# Patient Record
Sex: Female | Born: 1935 | Race: White | Hispanic: No | Smoking: Never smoker
Health system: Southern US, Community
[De-identification: ages and names within clinical notes are randomized; demographics above are authoritative.]

## PROBLEM LIST (undated history)

## (undated) DIAGNOSIS — E785 Hyperlipidemia, unspecified: Secondary | ICD-10-CM

## (undated) DIAGNOSIS — C50919 Malignant neoplasm of unspecified site of unspecified female breast: Secondary | ICD-10-CM

## (undated) DIAGNOSIS — M1990 Unspecified osteoarthritis, unspecified site: Secondary | ICD-10-CM

## (undated) DIAGNOSIS — Z923 Personal history of irradiation: Secondary | ICD-10-CM

## (undated) DIAGNOSIS — R42 Dizziness and giddiness: Secondary | ICD-10-CM

## (undated) DIAGNOSIS — E079 Disorder of thyroid, unspecified: Secondary | ICD-10-CM

## (undated) DIAGNOSIS — I10 Essential (primary) hypertension: Secondary | ICD-10-CM

## (undated) DIAGNOSIS — R739 Hyperglycemia, unspecified: Secondary | ICD-10-CM

## (undated) DIAGNOSIS — E039 Hypothyroidism, unspecified: Secondary | ICD-10-CM

## (undated) DIAGNOSIS — C50419 Malignant neoplasm of upper-outer quadrant of unspecified female breast: Secondary | ICD-10-CM

## (undated) DIAGNOSIS — H3530 Unspecified macular degeneration: Secondary | ICD-10-CM

## (undated) DIAGNOSIS — C4490 Unspecified malignant neoplasm of skin, unspecified: Secondary | ICD-10-CM

## (undated) HISTORY — DX: Hyperlipidemia, unspecified: E78.5

## (undated) HISTORY — PX: CATARACT EXTRACTION: SUR2

## (undated) HISTORY — DX: Hyperglycemia, unspecified: R73.9

## (undated) HISTORY — DX: Unspecified malignant neoplasm of skin, unspecified: C44.90

## (undated) HISTORY — PX: PERICARDIAL WINDOW: SHX2213

## (undated) HISTORY — DX: Unspecified macular degeneration: H35.30

## (undated) HISTORY — DX: Essential (primary) hypertension: I10

## (undated) HISTORY — DX: Disorder of thyroid, unspecified: E07.9

## (undated) HISTORY — DX: Malignant neoplasm of upper-outer quadrant of unspecified female breast: C50.419

## (undated) HISTORY — DX: Hypothyroidism, unspecified: E03.9

---

## 1966-06-12 HISTORY — PX: ABDOMINAL HYSTERECTOMY: SHX81

## 1983-06-13 DIAGNOSIS — I10 Essential (primary) hypertension: Secondary | ICD-10-CM

## 1983-06-13 HISTORY — DX: Essential (primary) hypertension: I10

## 1994-06-12 DIAGNOSIS — E079 Disorder of thyroid, unspecified: Secondary | ICD-10-CM

## 1994-06-12 HISTORY — DX: Disorder of thyroid, unspecified: E07.9

## 2004-03-15 ENCOUNTER — Ambulatory Visit: Payer: Self-pay | Admitting: Family Medicine

## 2005-04-17 ENCOUNTER — Ambulatory Visit: Payer: Self-pay | Admitting: Internal Medicine

## 2006-04-24 ENCOUNTER — Ambulatory Visit: Payer: Self-pay | Admitting: Internal Medicine

## 2006-05-04 ENCOUNTER — Ambulatory Visit: Payer: Self-pay | Admitting: Internal Medicine

## 2007-05-30 ENCOUNTER — Ambulatory Visit: Payer: Self-pay | Admitting: Internal Medicine

## 2007-06-13 HISTORY — PX: EYE SURGERY: SHX253

## 2008-06-12 HISTORY — PX: EYE SURGERY: SHX253

## 2009-04-08 ENCOUNTER — Ambulatory Visit: Payer: Self-pay | Admitting: Internal Medicine

## 2009-06-12 DIAGNOSIS — C50919 Malignant neoplasm of unspecified site of unspecified female breast: Secondary | ICD-10-CM

## 2009-06-12 HISTORY — PX: BREAST LUMPECTOMY: SHX2

## 2009-06-12 HISTORY — DX: Malignant neoplasm of unspecified site of unspecified female breast: C50.919

## 2009-06-12 HISTORY — PX: COLONOSCOPY: SHX174

## 2009-12-10 HISTORY — PX: BREAST BIOPSY: SHX20

## 2009-12-16 ENCOUNTER — Ambulatory Visit: Payer: Self-pay | Admitting: Internal Medicine

## 2009-12-22 ENCOUNTER — Ambulatory Visit: Payer: Self-pay | Admitting: Internal Medicine

## 2009-12-23 ENCOUNTER — Ambulatory Visit: Payer: Self-pay | Admitting: Internal Medicine

## 2010-01-10 ENCOUNTER — Ambulatory Visit: Payer: Self-pay | Admitting: Radiation Oncology

## 2010-01-10 ENCOUNTER — Ambulatory Visit: Payer: Self-pay | Admitting: General Surgery

## 2010-01-18 ENCOUNTER — Ambulatory Visit: Payer: Self-pay | Admitting: General Surgery

## 2010-01-18 DIAGNOSIS — C50419 Malignant neoplasm of upper-outer quadrant of unspecified female breast: Secondary | ICD-10-CM

## 2010-01-18 HISTORY — DX: Malignant neoplasm of upper-outer quadrant of unspecified female breast: C50.419

## 2010-01-24 ENCOUNTER — Ambulatory Visit: Payer: Self-pay | Admitting: Radiation Oncology

## 2010-01-25 LAB — PATHOLOGY REPORT

## 2010-02-10 ENCOUNTER — Ambulatory Visit: Payer: Self-pay | Admitting: Radiation Oncology

## 2010-03-12 ENCOUNTER — Ambulatory Visit: Payer: Self-pay | Admitting: Radiation Oncology

## 2010-06-15 ENCOUNTER — Ambulatory Visit: Payer: Self-pay | Admitting: General Surgery

## 2010-08-25 ENCOUNTER — Ambulatory Visit: Payer: Self-pay | Admitting: Radiation Oncology

## 2010-09-11 ENCOUNTER — Ambulatory Visit: Payer: Self-pay | Admitting: Radiation Oncology

## 2010-12-27 ENCOUNTER — Ambulatory Visit: Payer: Self-pay | Admitting: General Surgery

## 2011-02-27 ENCOUNTER — Ambulatory Visit: Payer: Self-pay | Admitting: Radiation Oncology

## 2011-03-13 ENCOUNTER — Ambulatory Visit: Payer: Self-pay | Admitting: Radiation Oncology

## 2011-06-13 DIAGNOSIS — H3530 Unspecified macular degeneration: Secondary | ICD-10-CM

## 2011-06-13 HISTORY — DX: Unspecified macular degeneration: H35.30

## 2011-07-05 ENCOUNTER — Ambulatory Visit: Payer: Self-pay | Admitting: General Surgery

## 2012-01-02 ENCOUNTER — Ambulatory Visit: Payer: Self-pay | Admitting: General Surgery

## 2013-01-14 ENCOUNTER — Ambulatory Visit: Payer: Self-pay | Admitting: General Surgery

## 2013-01-16 ENCOUNTER — Encounter: Payer: Self-pay | Admitting: General Surgery

## 2013-01-28 ENCOUNTER — Encounter: Payer: Self-pay | Admitting: General Surgery

## 2013-01-28 ENCOUNTER — Ambulatory Visit (INDEPENDENT_AMBULATORY_CARE_PROVIDER_SITE_OTHER): Payer: Medicare Other | Admitting: General Surgery

## 2013-01-28 VITALS — BP 178/78 | HR 64 | Resp 12 | Ht 66.0 in | Wt 160.0 lb

## 2013-01-28 DIAGNOSIS — Z853 Personal history of malignant neoplasm of breast: Secondary | ICD-10-CM | POA: Insufficient documentation

## 2013-01-28 NOTE — Patient Instructions (Signed)
Patient to return in one yaer. 

## 2013-01-28 NOTE — Progress Notes (Signed)
Patient ID: Crystal Blanchard, female   DOB: 1931/11/09, 79 y.o.   MRN: [id number]  Chief Complaint  Patient presents with  . Follow-up    mammogram    HPI Crystal Blanchard is a 79 y.o. female.  who presents for her annual breast evaluation and follow up mammogram. The most recent mammogram was done on 01-14-13. The patient continues to tolerate her tamoxifen without ill effect. Patient does perform regular self breast checks and gets regular mammograms done.    HPI  Past Medical History  Diagnosis Date  . Cancer 2011    R breast  . Hypertension 1985  . Thyroid disease 1996  . Macular degeneration 2013    white macular degeneration   . Skin cancer     Past Surgical History  Procedure Laterality Date  . Colonoscopy  2011    Dr Mechele Collin [facility]  . Breast biopsy Right 12/2009  . Eye surgery Left 2009  . Eye surgery Right 2010  . Abdominal hysterectomy  1968    Family History  Problem Relation Age of Onset  . Breast cancer Sister 49  . Cancer Father     bone  . Heart failure Mother     Social History History  Substance Use Topics  . Smoking status: Never Smoker   . Smokeless tobacco: Never Used  . Alcohol Use: No    No Known Allergies  Current Outpatient Prescriptions  Medication Sig Dispense Refill  . aspirin 81 MG tablet Take 81 mg by mouth daily.      . B Complex-C-Folic Acid (STRESS FORMULA) TABS Take 1 tablet by mouth daily.      . benazepril (LOTENSIN) 40 MG tablet Take 40 mg by mouth daily.       . Cholecalciferol (VITAMIN D3) 2000 UNITS TABS Take 1 tablet by mouth daily.      Marland Kitchen doxazosin (CARDURA) 8 MG tablet Take 8 mg by mouth at bedtime.       . hydrochlorothiazide (HYDRODIURIL) 25 MG tablet Take 25 mg by mouth daily.       Marland Kitchen levothyroxine (SYNTHROID, LEVOTHROID) 50 MCG tablet Take 50 mcg by mouth daily before breakfast.       . tamoxifen (NOLVADEX) 20 MG tablet Take 20 mg by mouth daily.        No current facility-administered medications for this visit.     Review of Systems Review of Systems  Constitutional: Negative.   Respiratory: Negative.   Cardiovascular: Negative.     Blood pressure 178/78, pulse 64, resp. rate 12, height 5\' 6"  (1.676 m), weight 160 lb (72.576 kg).  Physical Exam Physical Exam  Constitutional: She is oriented to person, place, and time. She appears well-developed and well-nourished.  Cardiovascular: Normal rate, regular rhythm and normal heart sounds.   Pulmonary/Chest: Breath sounds normal. Right breast exhibits no inverted nipple, no mass, no nipple discharge, no skin change and no tenderness. Left breast exhibits no inverted nipple, no mass, no nipple discharge, no skin change and no tenderness. Breasts are asymmetrical (left breast one cup sizes bigger than right).  Thickening at the upper outer quadrant right breast. Telangiectasias over the site of the radiation field. (Status post MammoSite accelerated partial breast radiation).  Lymphadenopathy:    She has no cervical adenopathy.    She has no axillary adenopathy.  Neurological: She is alert and oriented to person, place, and time.  Skin: Skin is warm and dry.   Neck  Data Reviewed Bilateral mammograms dated 01/14/2013 showed no  interval change. BI-RAD-2.  Assessment    Doing well status post DCIS treatment, tolerating tamoxifen well.    Plan    The patient will return in one year for repeat exam with lateral diagnostic mammograms. She'll continue tamoxifen 20 mg daily.       Earline Mayotte 01/29/2013, 5:43 PM

## 2013-01-29 ENCOUNTER — Encounter: Payer: Self-pay | Admitting: General Surgery

## 2013-09-05 DIAGNOSIS — Z853 Personal history of malignant neoplasm of breast: Secondary | ICD-10-CM

## 2014-01-22 ENCOUNTER — Ambulatory Visit: Payer: Self-pay | Admitting: General Surgery

## 2014-01-22 ENCOUNTER — Encounter: Payer: Self-pay | Admitting: General Surgery

## 2014-01-29 ENCOUNTER — Ambulatory Visit (INDEPENDENT_AMBULATORY_CARE_PROVIDER_SITE_OTHER): Payer: Medicare Other | Admitting: General Surgery

## 2014-01-29 ENCOUNTER — Encounter: Payer: Self-pay | Admitting: General Surgery

## 2014-01-29 VITALS — BP 138/68 | HR 70 | Resp 14 | Ht 66.0 in | Wt 178.0 lb

## 2014-01-29 DIAGNOSIS — Z853 Personal history of malignant neoplasm of breast: Secondary | ICD-10-CM

## 2014-01-29 NOTE — Progress Notes (Signed)
Patient ID: Crystal Blanchard, female   DOB: 02/25/1932, 80 y.o.   MRN: [id number]  Chief Complaint  Patient presents with  . Follow-up    mammogram     HPI Crystal Blanchard is a 80 y.o. female who presents for a breast evaluation. The most recent mammogram was done on 01/22/14. Patient does perform regular self breast checks and gets regular mammograms done. The patient denies any new problems at this time.     HPI  Past Medical History  Diagnosis Date  . Hypertension 1985  . Thyroid disease 1996  . Macular degeneration 2013    white macular degeneration   . 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 ER 90%, PR 90%..  . Skin cancer     Past Surgical History  Procedure Laterality Date  . Colonoscopy  2011    Dr Vira Agar [facility]  . Breast biopsy Right 12/2009  . Eye surgery Left 2009  . Eye surgery Right 2010  . Abdominal hysterectomy  1968    Family History  Problem Relation Age of Onset  . Breast cancer Sister 72  . Cancer Father     bone  . Heart failure Mother     Social History History  Substance Use Topics  . Smoking status: Never Smoker   . Smokeless tobacco: Never Used  . Alcohol Use: No    No Known Allergies  Current Outpatient Prescriptions  Medication Sig Dispense Refill  . aspirin 81 MG tablet Take 81 mg by mouth daily.      . B Complex-C-Folic Acid (STRESS FORMULA) TABS Take 1 tablet by mouth daily.      . benazepril (LOTENSIN) 40 MG tablet Take 40 mg by mouth daily.       . Cholecalciferol (VITAMIN D3) 2000 UNITS TABS Take 1 tablet by mouth daily.      Marland Kitchen doxazosin (CARDURA) 8 MG tablet Take 8 mg by mouth at bedtime.       . hydrochlorothiazide (HYDRODIURIL) 25 MG tablet Take 25 mg by mouth daily.       Marland Kitchen levothyroxine (SYNTHROID, LEVOTHROID) 50 MCG tablet Take 50 mcg by mouth daily before breakfast.       . tamoxifen (NOLVADEX)  20 MG tablet Take 20 mg by mouth daily.        No current facility-administered medications for this visit.    Review of Systems Review of Systems  Constitutional: Negative.   Respiratory: Negative.   Cardiovascular: Negative.     Blood pressure 138/68, pulse 70, resp. rate 14, height 5\' 6"  (1.676 m), weight 178 lb (80.74 kg).  Physical Exam Physical Exam  Constitutional: She is oriented to person, place, and time. She appears well-developed and well-nourished.  Neck: Neck supple. No thyromegaly present.  Cardiovascular: Normal rate, regular rhythm and normal heart sounds.   No murmur heard. Pulmonary/Chest: Effort normal and breath sounds normal. Right breast exhibits no inverted nipple, no mass, no nipple discharge, no skin change and no tenderness. Left breast exhibits no inverted nipple, no mass, no nipple discharge, no skin change and no tenderness.    Right breast 1 cup size smaller than the left.  Slight tethering at the right outer quadrant of the right breast.   Lymphadenopathy:    She has no cervical adenopathy.    She has no axillary adenopathy.  Neurological: She is alert  and oriented to person, place, and time.  Skin: Skin is warm and dry.    Data Reviewed Bilateral mammogram showed R. Is 13, 2015 were reviewed. No interval change. BI-RAD-2.  Assessment    Doing well now 4 years status post excision of DCIS.     Plan    The patient will continue tamoxifen 20 mg daily. Followup examination with bilateral mammograms in one year.     PCP: Irven Easterly 01/30/2014, 9:16 PM

## 2014-01-29 NOTE — Patient Instructions (Signed)
Patient to return in 1 year with mammogram. Continue self breast exams. Call office for any new breast issues or concerns.

## 2014-01-30 ENCOUNTER — Encounter: Payer: Self-pay | Admitting: General Surgery

## 2014-02-23 ENCOUNTER — Encounter: Payer: Self-pay | Admitting: General Surgery

## 2014-04-13 ENCOUNTER — Encounter: Payer: Self-pay | Admitting: General Surgery

## 2014-11-18 ENCOUNTER — Other Ambulatory Visit: Payer: Self-pay | Admitting: Internal Medicine

## 2014-11-18 DIAGNOSIS — R1011 Right upper quadrant pain: Secondary | ICD-10-CM

## 2014-11-20 ENCOUNTER — Ambulatory Visit
Admission: RE | Admit: 2014-11-20 | Discharge: 2014-11-20 | Disposition: A | Discharge status: Home / Self Care | Payer: Medicare Other | Source: Ambulatory Visit | Attending: Internal Medicine | Admitting: Internal Medicine

## 2014-11-20 DIAGNOSIS — R1011 Right upper quadrant pain: Secondary | ICD-10-CM | POA: Diagnosis present

## 2014-11-20 DIAGNOSIS — K760 Fatty (change of) liver, not elsewhere classified: Secondary | ICD-10-CM | POA: Diagnosis not present

## 2014-11-20 DIAGNOSIS — K8020 Calculus of gallbladder without cholecystitis without obstruction: Secondary | ICD-10-CM | POA: Insufficient documentation

## 2014-11-24 ENCOUNTER — Ambulatory Visit: Payer: Self-pay

## 2014-12-17 ENCOUNTER — Other Ambulatory Visit: Payer: Self-pay

## 2014-12-17 DIAGNOSIS — C50411 Malignant neoplasm of upper-outer quadrant of right female breast: Secondary | ICD-10-CM

## 2014-12-21 IMAGING — MG MM CAD DIAGNOSTIC MAMMO
1 series · 6 of 6 positions shown · non-contrast
Comparison: none

REASON FOR EXAM: HX BR CA YRLY
COMMENTS:

PROCEDURE:     MAM - MAM DGTL DIAGNOSTIC MAMMO W/CAD  - January 14, 2013 [DATE]
RESULT:     Bilateral digital diagnostic mammogram CAD dated 10/29/1912
Comparison study/studies dated: 12/22/2009, 07/05/2011, 12/27/2010, 06/15/2010,
12/23/2009

[R CC · right · 6 of 6 slices shown]
[im 1/6]
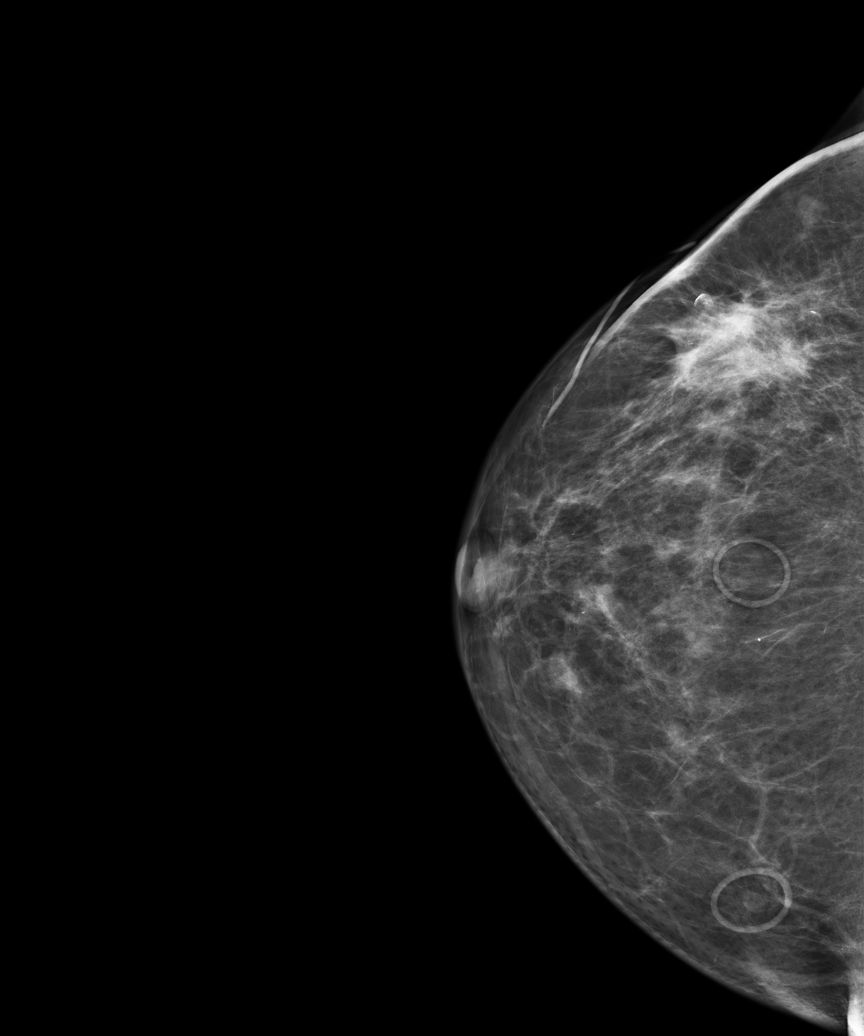
[im 2/6]
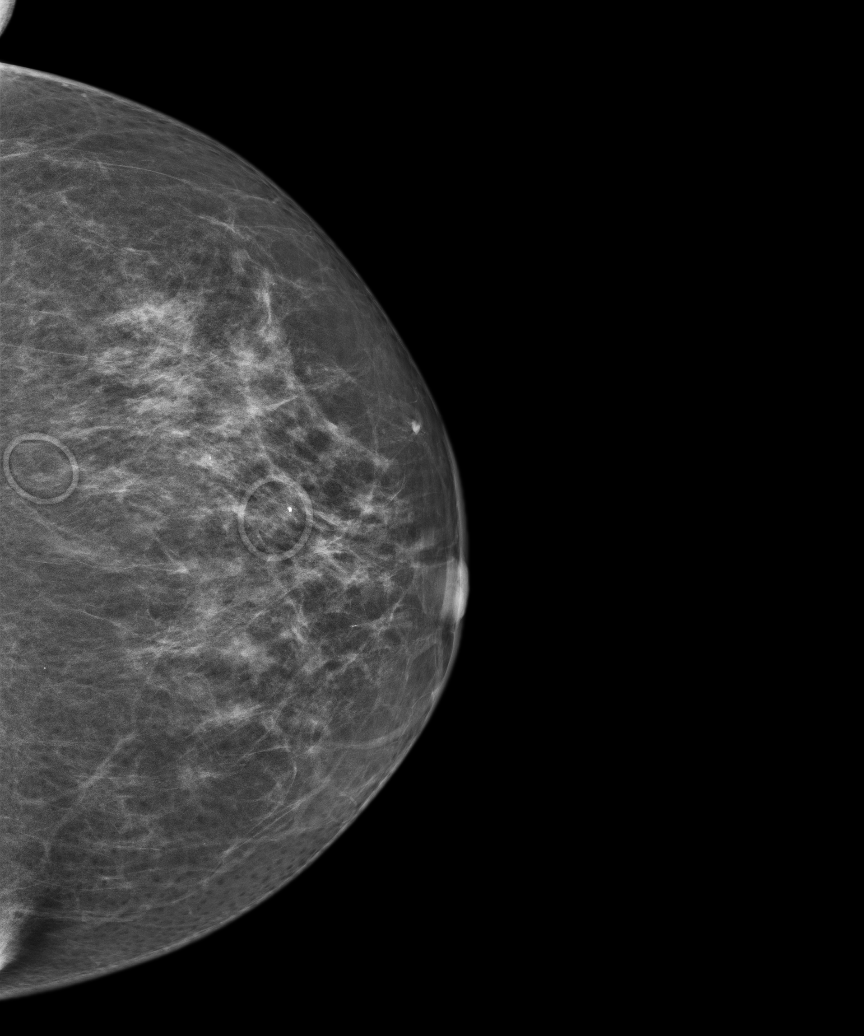
[im 3/6]
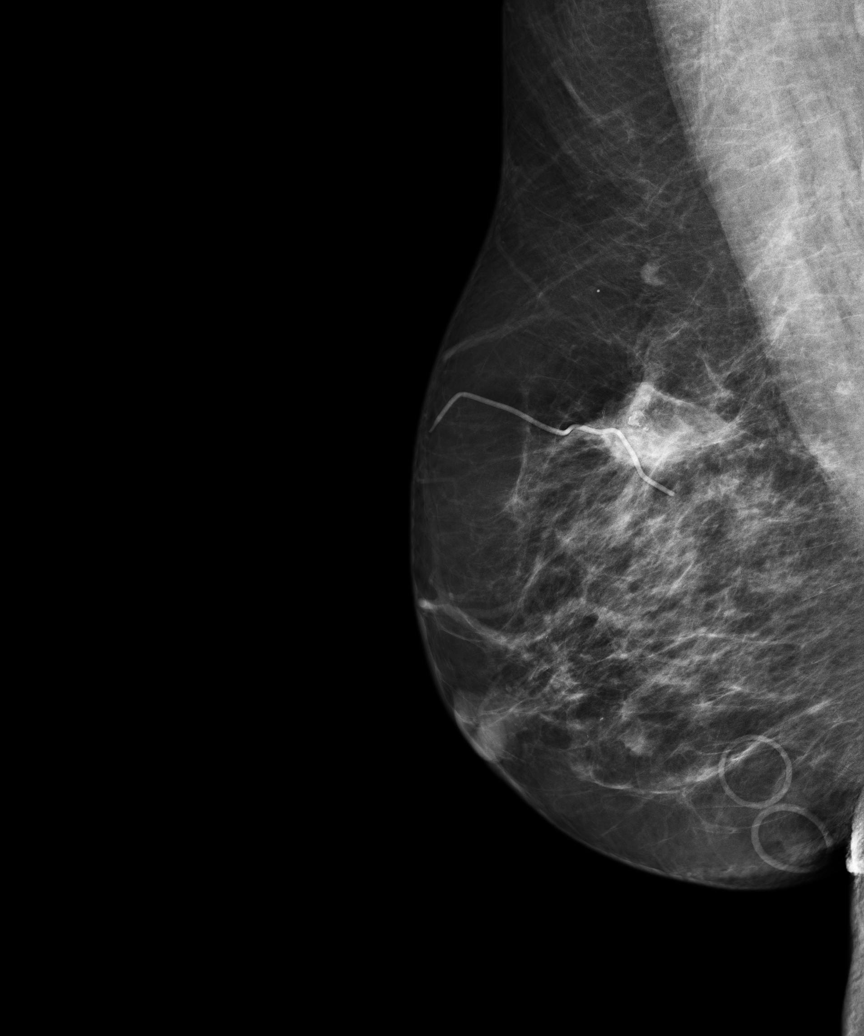
[im 4/6]
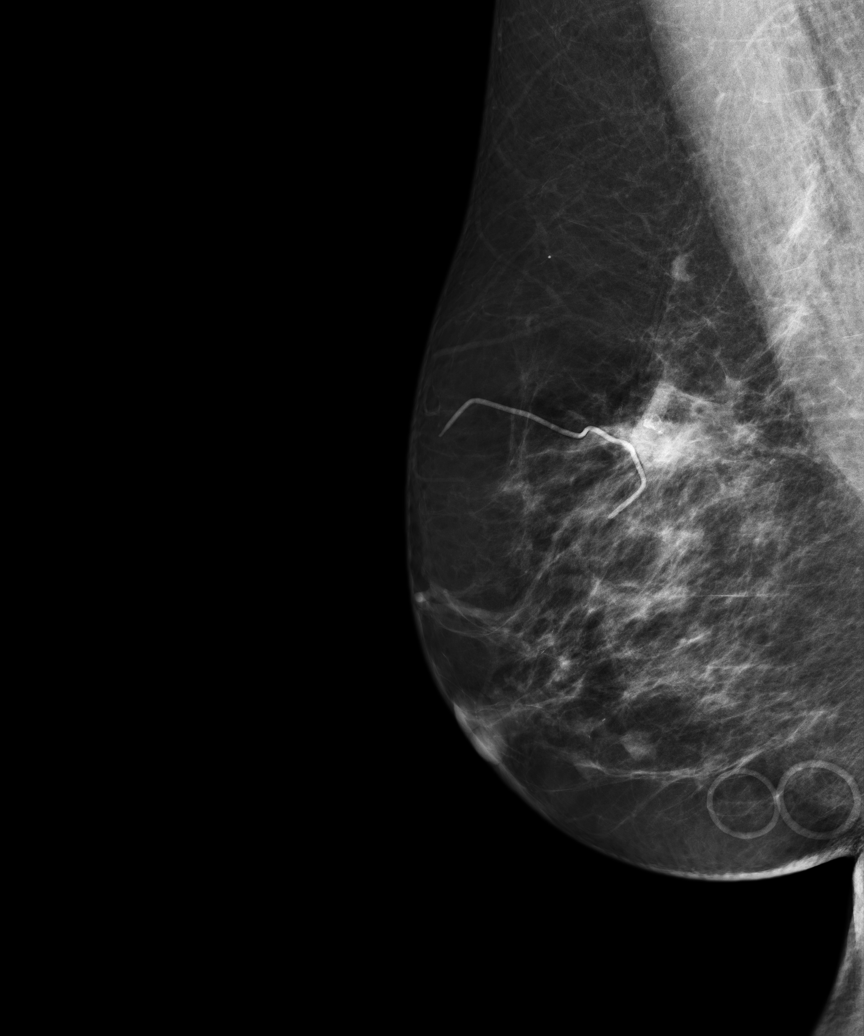
[im 5/6]
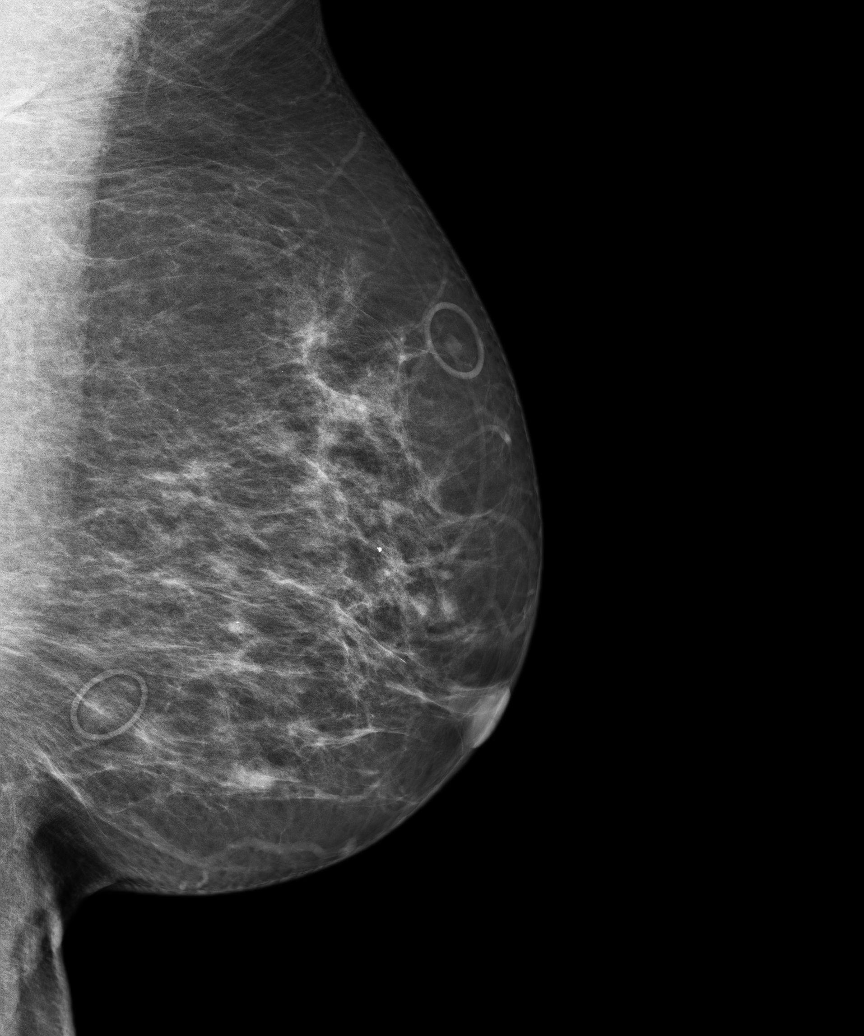
[im 6/6]
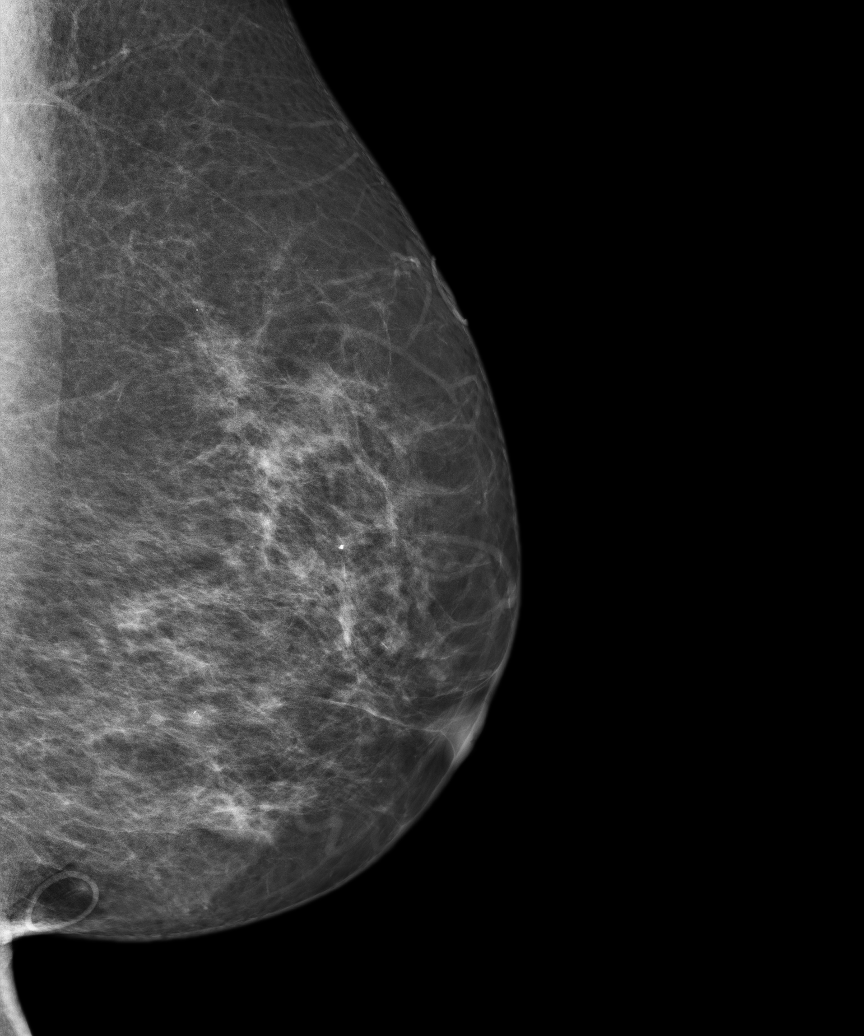

[6 of 6 positions shown; findings below may reference images not displayed]

FINDINGS: BREAST COMPOSITION: The breast composition is HETEROGENEOUSLY DENSE
(glandular tissue is 51-75%) This may decrease the sensitivity of
mammography.

There is no new radiographic evidence to suggest malignancy.
IMPRESSION: BI-RADS: Category 2- Benign Finding

A NEGATIVE MAMMOGRAM REPORT DOES NOT PRECLUDE BIOPSY OR OTHER EVALUATION OF
A CLINICALLY PALPABLE OR OTHERWISE SUSPICIOUS MASS OR LESION. BREAST CANCER
MAY NOT BE DETECTED IN UP TO 10% OF CASES.

## 2014-12-24 ENCOUNTER — Ambulatory Visit: Payer: Medicare Other

## 2014-12-24 ENCOUNTER — Other Ambulatory Visit: Payer: Medicare Other

## 2014-12-25 ENCOUNTER — Ambulatory Visit: Payer: Self-pay | Admitting: Surgery

## 2015-01-04 ENCOUNTER — Encounter (INDEPENDENT_AMBULATORY_CARE_PROVIDER_SITE_OTHER): Payer: Self-pay

## 2015-01-04 ENCOUNTER — Encounter: Payer: Self-pay | Admitting: Surgery

## 2015-01-04 ENCOUNTER — Ambulatory Visit (INDEPENDENT_AMBULATORY_CARE_PROVIDER_SITE_OTHER): Payer: Medicare Other | Admitting: Surgery

## 2015-01-04 VITALS — BP 177/85 | HR 70 | Temp 98.6°F | Ht 67.0 in | Wt 176.0 lb

## 2015-01-04 DIAGNOSIS — K8020 Calculus of gallbladder without cholecystitis without obstruction: Secondary | ICD-10-CM | POA: Diagnosis not present

## 2015-01-04 NOTE — Patient Instructions (Signed)
Patient to follow up as needed

## 2015-01-04 NOTE — Progress Notes (Signed)
Surgical Consultation  01/04/2015  Crystal Blanchard is an 81 y.o. female. With right upper quadrant right flank and right back pain for more than a month.  Chief Complaint  Patient presents with  . Other    Gallstones     HPI: She has had intermittent right upper quadrant right flank right back pain for several weeks to more than a month. She had one episode of severe pain which prompted her evaluation. Workup by her primary care physician demonstrated evidence for cholelithiasis without cholecystitis. She denies any previous similar problems.  She denies a history of hepatitis yellow jaundice pancreatitis peptic ulcer disease or diverticulitis. She's had a previous abdominal hysterectomy. She denies any cardiac disease or diabetes. She does have a history of hypertension and thyroid disease.  Past Medical History  Diagnosis Date  . Hypertension 1985  . Thyroid disease 1996  . Macular degeneration 2013    white macular degeneration   . Hypothyroidism   . Hyperlipidemia   . 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 ER 90%, PR 90%..  . Skin cancer   . Elevated blood sugar     Past Surgical History  Procedure Laterality Date  . Colonoscopy  2011    Dr Vira Agar [facility]  . Breast biopsy Right 12/2009  . Eye surgery Left 2009  . Eye surgery Right 2010  . Abdominal hysterectomy  1968    Family History  Problem Relation Age of Onset  . Breast cancer Sister 93  . Cancer Father     bone  . Heart failure Mother     Social History:  reports that she has never smoked. She has never used smokeless tobacco. She reports that she does not drink alcohol or use illicit drugs.  Allergies: No Known Allergies  Medications reviewed.     Review of Systems  Constitutional: Positive for weight loss. Negative for fever, chills and malaise/fatigue.  HENT:  Negative.   Eyes: Negative.   Respiratory: Negative for cough and shortness of breath.   Cardiovascular: Positive for leg swelling. Negative for chest pain and palpitations.  Gastrointestinal: Positive for nausea and abdominal pain. Negative for vomiting.  Genitourinary: Negative.   Musculoskeletal: Negative.   Neurological: Negative.   Endo/Heme/Allergies: Negative.   Psychiatric/Behavioral: Negative.        BP 177/85 mmHg  Pulse 70  Temp(Src) 98.6 F (37 C)  Ht 5\' 7"  (1.702 m)  Wt 176 lb (79.833 kg)  BMI 27.56 kg/m2  Physical Exam  Constitutional: She is oriented to person, place, and time and well-developed, well-nourished, and in no distress.  HENT:  Head: Normocephalic and atraumatic.  Eyes: Conjunctivae are normal. Pupils are equal, round, and reactive to light.  Neck: Normal range of motion. Neck supple.  Cardiovascular: Regular rhythm and normal heart sounds.   Pulmonary/Chest: Effort normal and breath sounds normal.  Abdominal: Soft. Bowel sounds are normal.  Musculoskeletal: Normal range of motion. She exhibits no edema.  Neurological: She is alert and oriented to person, place, and time.  Skin: Skin is warm and dry.  Psychiatric: Mood and judgment normal.      No results found for this or any previous visit (from the past 48 hour(s)). No results found.  Assessment/Plan: 1. Calculus of gallbladder without cholecystitis without obstruction I have independently reviewed her results. She has evidence for cholelithiasis without cholecystitis. She does  not have any pertinent liver function studies performed at this time. She does appear to be minimally symptomatic at most times with only one serious episode which prompted this evaluation. Her daughter is present. They do not want to consider surgery at this point unless she develops further symptoms. At her age, I think that approaches a reasonable one and we'll be available should she change her mind. Both daughter  and patient are in agreement.   Dia Crawford III dermatitis

## 2015-01-26 ENCOUNTER — Ambulatory Visit: Payer: Medicare Other

## 2015-01-26 ENCOUNTER — Ambulatory Visit
Admission: RE | Admit: 2015-01-26 | Discharge: 2015-01-26 | Disposition: A | Discharge status: Home / Self Care | Payer: Medicare Other | Source: Ambulatory Visit | Attending: General Surgery | Admitting: General Surgery

## 2015-01-26 ENCOUNTER — Other Ambulatory Visit: Payer: Self-pay | Admitting: General Surgery

## 2015-01-26 DIAGNOSIS — C50411 Malignant neoplasm of upper-outer quadrant of right female breast: Secondary | ICD-10-CM

## 2015-01-26 DIAGNOSIS — Z853 Personal history of malignant neoplasm of breast: Secondary | ICD-10-CM | POA: Insufficient documentation

## 2015-01-26 HISTORY — DX: Malignant neoplasm of unspecified site of unspecified female breast: C50.919

## 2015-02-01 ENCOUNTER — Ambulatory Visit (INDEPENDENT_AMBULATORY_CARE_PROVIDER_SITE_OTHER): Payer: Medicare Other | Admitting: General Surgery

## 2015-02-01 ENCOUNTER — Encounter: Payer: Self-pay | Admitting: General Surgery

## 2015-02-01 VITALS — BP 124/68 | HR 80 | Ht 66.0 in | Wt 174.0 lb

## 2015-02-01 DIAGNOSIS — Z853 Personal history of malignant neoplasm of breast: Secondary | ICD-10-CM | POA: Diagnosis not present

## 2015-02-01 DIAGNOSIS — K8020 Calculus of gallbladder without cholecystitis without obstruction: Secondary | ICD-10-CM | POA: Insufficient documentation

## 2015-02-01 NOTE — Progress Notes (Signed)
Patient ID: Crystal Blanchard, female   DOB: Oct 26, 1931, 81 y.o.   MRN: [id number]  Chief Complaint  Patient presents with  . Follow-up    Bilateral Mammogram    HPI Crystal Blanchard is a 81 y.o. female who presents for a breast evaluation. The most recent mammogram was done on 01/26/2015 .  Patient does perform regular self breast checks and gets regular mammograms done. Denies any new breast problems.  Patient states she had a gallbladder attack at the end of July and saw Dr. Pat Patrick. She had an abdominal ultrasound done on 11/20/14 which showed gallstones. She stated she did not want to have surgery at this time for the gallstones, she is aware to go to the hospital if an attack occurs.    HPI  Past Medical History  Diagnosis Date  . Hypertension 1985  . Thyroid disease 1996  . Macular degeneration 2013    white macular degeneration   . Hypothyroidism   . Hyperlipidemia   . Elevated blood sugar   . 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 ER 90%, PR 90%..  . Skin cancer   . Breast cancer 2011    Wide excision, radiation therapy, tamoxifen. Intermediate grade DCIS.    Past Surgical History  Procedure Laterality Date  . Colonoscopy  2011    Dr Vira Agar [facility]  . Eye surgery Left 2009  . Eye surgery Right 2010  . Abdominal hysterectomy  1968  . Breast biopsy Right 12/2009    Wide excision DCIS., Mammosite placement August 2011.    Family History  Problem Relation Age of Onset  . Breast cancer Sister 82  . Cancer Father     bone  . Heart failure Mother     Social History Social History  Substance Use Topics  . Smoking status: Never Smoker   . Smokeless tobacco: Never Used  . Alcohol Use: No    No Known Allergies  Current Outpatient Prescriptions  Medication Sig Dispense Refill  . aspirin 81 MG tablet Take 81 mg by mouth daily.    . B  Complex-C-Folic Acid (STRESS FORMULA) TABS Take 1 tablet by mouth daily.    . benazepril (LOTENSIN) 40 MG tablet Take 40 mg by mouth daily.     . Cholecalciferol (VITAMIN D3) 2000 UNITS TABS Take 1 tablet by mouth daily.    Marland Kitchen doxazosin (CARDURA) 8 MG tablet Take 8 mg by mouth at bedtime.     . hydrochlorothiazide (HYDRODIURIL) 25 MG tablet Take 25 mg by mouth daily.     Marland Kitchen levothyroxine (SYNTHROID, LEVOTHROID) 50 MCG tablet Take 50 mcg by mouth daily before breakfast. Then 15mcg on Monday and Thursday    . Multiple Vitamins-Minerals (PRESERVISION AREDS 2 PO) Take 1 tablet by mouth 2 (two) times daily.    . tamoxifen (NOLVADEX) 20 MG tablet Take 20 mg by mouth daily.      No current facility-administered medications for this visit.    Review of Systems Review of Systems  Constitutional: Negative.   Respiratory: Negative.   Cardiovascular: Negative.     Blood pressure 124/68, pulse 80, height 5\' 6"  (1.676 m), weight 174 lb (78.926 kg).  Physical Exam Physical Exam  Constitutional: She is oriented to person, place, and time. She appears well-developed and well-nourished.  Eyes: Conjunctivae are normal. No scleral icterus.  Neck: Neck supple. No  thyromegaly present.  Cardiovascular: Normal rate, regular rhythm and normal heart sounds.   Pulmonary/Chest: Effort normal and breath sounds normal. Right breast exhibits no inverted nipple, no mass, no nipple discharge, no skin change and no tenderness. Left breast exhibits no inverted nipple, no mass, no nipple discharge, no skin change and no tenderness.    Deformity in upper outer quadrant right breast  Lymphadenopathy:    She has no cervical adenopathy.    She has no axillary adenopathy.  Neurological: She is alert and oriented to person, place, and time.  Skin: Skin is warm and dry.  Psychiatric: Her behavior is normal.    Data Reviewed Abdominal ultrasound of 01/28/2015 showed multiple gallstones without evidence of acute  cholecystitis.  Independent review of her most recently completed 01/26/2015 mammograms was completed. No interval change. BI-RADS-2.  Assessment    Doing well now 5 years status post removal intermediate grade 6 mm DCIS from the right breast.    Plan    The patient has completed 5 years of tamoxifen therapy. She was instructed to complete her presence supply and then discontinue the medication.  She should have annual diagnostic mammograms with her primary care provider. Should there be any change in her mammograms or clinical exams with her PCP, she is welcome to return at any time.  She was encouraged to call promptly rather than waiting days if she has a recurrent episode of right upper quadrant/right subscapular pain to suggest recurrent cholecystitis.     The patient has been asked to return to her primary care doctor for her mammograms.   PCP: Dr. Annett Fabian, [place] 02/01/2015, 9:11 AM

## 2015-02-01 NOTE — Patient Instructions (Signed)
Complete current supply of Tamoxifen then discontinue. The patient has been asked to return to her primary care doctor for her mammograms.

## 2015-05-12 ENCOUNTER — Ambulatory Visit (INDEPENDENT_AMBULATORY_CARE_PROVIDER_SITE_OTHER): Payer: Medicare Other | Admitting: General Surgery

## 2015-05-12 ENCOUNTER — Encounter: Payer: Self-pay | Admitting: General Surgery

## 2015-05-12 VITALS — BP 144/74 | HR 88 | Resp 16 | Ht 66.0 in | Wt 183.0 lb

## 2015-05-12 DIAGNOSIS — K8020 Calculus of gallbladder without cholecystitis without obstruction: Secondary | ICD-10-CM | POA: Diagnosis not present

## 2015-05-12 NOTE — Patient Instructions (Addendum)
The patient is aware to call back for any questions or concerns. Laparoscopic Cholecystectomy Laparoscopic cholecystectomy is surgery to remove the gallbladder. The gallbladder is located in the upper right part of the abdomen, behind the liver. It is a storage sac for bile, which is produced in the liver. Bile aids in the digestion and absorption of fats. Cholecystectomy is often done for inflammation of the gallbladder (cholecystitis). This condition is usually caused by a buildup of gallstones (cholelithiasis) in the gallbladder. Gallstones can block the flow of bile, and that can result in inflammation and pain. In severe cases, emergency surgery may be required. If emergency surgery is not required, you will have time to prepare for the procedure. Laparoscopic surgery is an alternative to open surgery. Laparoscopic surgery has a shorter recovery time. Your common bile duct may also need to be examined during the procedure. If stones are found in the common bile duct, they may be removed. LET [facility] CARE PROVIDER KNOW ABOUT:  Any allergies you have.  All medicines you are taking, including vitamins, herbs, eye drops, creams, and over-the-counter medicines.  Previous problems you or members of your family have had with the use of anesthetics.  Any blood disorders you have.  Previous surgeries you have had.  Any medical conditions you have. RISKS AND COMPLICATIONS Generally, this is a safe procedure. However, problems may occur, including:  Infection.  Bleeding.  Allergic reactions to medicines.  Damage to other structures or organs.  A stone remaining in the common bile duct.  A bile leak from the cyst duct that is clipped when your gallbladder is removed.  The need to convert to open surgery, which requires a larger incision in the abdomen. This may be necessary if your surgeon thinks that it is not safe to continue with a laparoscopic procedure. BEFORE THE PROCEDURE  Ask  your health care provider about:  Changing or stopping your regular medicines. This is especially important if you are taking diabetes medicines or blood thinners.  Taking medicines such as aspirin and ibuprofen. These medicines can thin your blood. Do not take these medicines before your procedure if your health care provider instructs you not to.  Follow instructions from your health care provider about eating or drinking restrictions.  Let your health care provider know if you develop a cold or an infection before surgery.  Plan to have someone take you home after the procedure.  Ask your health care provider how your surgical site will be marked or identified.  You may be given antibiotic medicine to help prevent infection. PROCEDURE  To reduce your risk of infection:  Your health care team will wash or sanitize their hands.  Your skin will be washed with soap.  An IV tube may be inserted into one of your veins.  You will be given a medicine to make you fall asleep (general anesthetic).  A breathing tube will be placed in your mouth.  The surgeon will make several small cuts (incisions) in your abdomen.  A thin, lighted tube (laparoscope) that has a tiny camera on the end will be inserted through one of the small incisions. The camera on the laparoscope will send a picture to a TV screen (monitor) in the operating room. This will give the surgeon a good view inside your abdomen.  A gas will be pumped into your abdomen. This will expand your abdomen to give the surgeon more room to perform the surgery.  Other tools that are needed for  the procedure will be inserted through the other incisions. The gallbladder will be removed through one of the incisions.  After your gallbladder has been removed, the incisions will be closed with stitches (sutures), staples, or skin glue.  Your incisions may be covered with a bandage (dressing). The procedure may vary among health care  providers and hospitals. AFTER THE PROCEDURE  Your blood pressure, heart rate, breathing rate, and blood oxygen level will be monitored often until the medicines you were given have worn off.  You will be given medicines as needed to control your pain.   This information is not intended to replace advice given to you by your health care provider. Make sure you discuss any questions you have with your health care provider.   Document Released: 05/29/2005 Document Revised: 02/17/2015 Document Reviewed: 01/08/2013 Elsevier Interactive Patient Education 2016 [place].  Patient's surgery has been scheduled for 05-28-15 at [facility].

## 2015-05-12 NOTE — Progress Notes (Signed)
Patient ID: Crystal Blanchard, female   DOB: Oct 14, 1931, 81 y.o.   MRN: [id number]  Chief Complaint  Patient presents with  . Follow-up    gall stones    HPI Crystal Blanchard is a 81 y.o. female.  Here today for follow up gall stones. She did have 2 episodes of pain last week that started in her back and came around to her front. Sh e feels nauseated several times a week but no vomiting. Eating does not seem to trigger the pain. She thinks she is ready to get her gall bladder out. The pain starts in the back and rolls into the right upper quadrant behind the rib cage. Each episode has had a similar pattern.  The patient feels that she somewhat bloated, but this comes and goes. She is changed her dietary pattern eating for smaller meals today. It was difficult to pinpoint that she is truly experiencing early satiety.  She had seen Dr Pat Patrick in July in July of this year, and at that time had deferred on surgical intervention. When the patient was seen here in August 2016 in follow-up of her breast cancer, at that time she had remained asymptomatic and declined surgical intervention. Abdominal ultrasound was November 20 2014. Bowels move daily and no bleeding. No history of jaundice. I personally reviewed the patient's history. HPI  Past Medical History  Diagnosis Date  . Hypertension 1985  . Thyroid disease 1996  . Macular degeneration 2013    white macular degeneration   . Hypothyroidism   . Hyperlipidemia   . Elevated blood sugar   . Malignant neoplasm of upper-outer quadrant of female breast [facility]) January 18, 2010    DCIS, 6 mm.core biopsy dated December 28, 2009 showed florid ductal hyperplasia with focal atypia. ER 90%, PR 90%..  . Skin cancer   . Breast cancer [facility]) 2011    Wide excision, radiation therapy, tamoxifen. Intermediate grade DCIS.    Past Surgical History  Procedure Laterality Date  . Colonoscopy  2011    Dr Vira Agar [facility]  . Eye surgery Left 2009  . Eye surgery Right 2010  .  Abdominal hysterectomy  1968  . Breast biopsy Right 12/2009    Wide excision DCIS., Mammosite placement August 2011.    Family History  Problem Relation Age of Onset  . Breast cancer Sister 70  . Cancer Father     bone  . Heart failure Mother     Social History Social History  Substance Use Topics  . Smoking status: Never Smoker   . Smokeless tobacco: Never Used  . Alcohol Use: No    No Known Allergies  Current Outpatient Prescriptions  Medication Sig Dispense Refill  . aspirin 81 MG tablet Take 81 mg by mouth daily.    . B Complex-C-Folic Acid (STRESS FORMULA) TABS Take 1 tablet by mouth daily.    . benazepril (LOTENSIN) 40 MG tablet Take 40 mg by mouth daily.     . Cholecalciferol (VITAMIN D3) 2000 UNITS TABS Take 1 tablet by mouth daily.    Marland Kitchen doxazosin (CARDURA) 8 MG tablet Take 8 mg by mouth at bedtime.     . hydrochlorothiazide (HYDRODIURIL) 25 MG tablet Take 25 mg by mouth daily.     Marland Kitchen levothyroxine (SYNTHROID, LEVOTHROID) 50 MCG tablet Take 50 mcg by mouth daily before breakfast. Then 61mcg on Monday and Thursday    . Multiple Vitamins-Minerals (PRESERVISION AREDS 2 PO) Take 1 tablet by mouth 2 (two) times daily.  No current facility-administered medications for this visit.    Review of Systems Review of Systems  Constitutional: Negative.  Appetite change: weight gain.  HENT: Negative.   Respiratory: Negative.   Cardiovascular: Negative.   Gastrointestinal: Positive for nausea. Negative for vomiting.  Endocrine: Negative.   Genitourinary: Negative.   Allergic/Immunologic: Negative.   Hematological: Negative.     Blood pressure 144/74, pulse 88, resp. rate 16, height 5\' 6"  (1.676 m), weight 183 lb (83.008 kg). Patient's weight is up 9 pounds from August 2016, 7 pounds from July 2015.  Physical Exam Physical Exam  Constitutional: She appears well-developed and well-nourished.  HENT:  Mouth/Throat: Oropharynx is clear and moist.  Eyes: Conjunctivae are  normal. No scleral icterus.  Neck: Neck supple.  Cardiovascular: Normal rate, regular rhythm and normal heart sounds.   Pulses:      Femoral pulses are 2+ on the right side, and 2+ on the left side.      Dorsalis pedis pulses are 2+ on the right side, and 2+ on the left side.       Posterior tibial pulses are 2+ on the right side, and 2+ on the left side.  Pulmonary/Chest: Effort normal and breath sounds normal.  Abdominal: Soft. Normal appearance and bowel sounds are normal. She exhibits shifting dullness (Suggestion of shifting dullness, with only modest distention.). She exhibits no ascites. There is no tenderness.  Slight fullness in abdomen.  Lymphadenopathy:    She has no cervical adenopathy.  Neurological: She is alert.  Skin: Skin is warm and dry.  Psychiatric: Her behavior is normal.    Data Reviewed 11/20/2014: EXAM: US ABDOMEN LIMITED - RIGHT UPPER QUADRANT  COMPARISON: None.  FINDINGS: Gallbladder:  There are multiple echogenic mobile shadowing stones. The largest measures 1 cm in diameter. There is no gallbladder wall thickening, pericholecystic fluid, or positive sonographic Murphy's sign.  Common bile duct:  Diameter: 3.7 mm  Liver:  The hepatic echotexture is mildly increased diffusely. There is no focal mass or ductal dilation.  IMPRESSION: 1. Gallstones without evidence of acute cholecystitis. 2. Fatty infiltrative change of the liver.   Assessment    Gallstones, possibly symptomatic.    Plan    We'll review laboratory studies from her PCP when available.    Laparoscopic Cholecystectomy with Intraoperative Cholangiogram. The procedure, including it's potential risks and complications (including but not limited to infection, bleeding, injury to intra-abdominal organs or bile ducts, bile leak, poor cosmetic result, sepsis and death) were discussed with the patient in detail. Non-operative options, including their inherent risks (acute  calculous cholecystitis with possible choledocholithiasis or gallstone pancreatitis, with the risk of ascending cholangitis, sepsis, and death) were discussed as well. The patient expressed and understanding of what we discussed and wishes to proceed with laparoscopic cholecystectomy. The patient further understands that if it is technically not possible, or it is unsafe to proceed laparoscopically, that I will convert to an open cholecystectomy.  Patient's surgery has been scheduled for 05-28-15 at [facility]. It is okay for patient to continue 81 mg aspirin once daily.   PCP:  Almetta Lovely 05/13/2015, 8:28 AM

## 2015-05-19 ENCOUNTER — Other Ambulatory Visit: Payer: Self-pay | Admitting: General Surgery

## 2015-05-19 ENCOUNTER — Other Ambulatory Visit: Payer: Medicare Other

## 2015-05-19 ENCOUNTER — Encounter: Payer: Self-pay | Admitting: *Deleted

## 2015-05-19 DIAGNOSIS — K8020 Calculus of gallbladder without cholecystitis without obstruction: Secondary | ICD-10-CM

## 2015-05-19 NOTE — H&P (Signed)
HPI Crystal Blanchard is a 81 y.o. female. Here today for follow up gall stones. She did have 2 episodes of pain last week that started in her back and came around to her front. Sh e feels nauseated several times a week but no vomiting. Eating does not seem to trigger the pain. She thinks she is ready to get her gall bladder out. The pain starts in the back and rolls into the right upper quadrant behind the rib cage. Each episode has had a similar pattern.  The patient feels that she somewhat bloated, but this comes and goes. She is changed her dietary pattern eating for smaller meals today. It was difficult to pinpoint that she is truly experiencing early satiety.  She had seen Dr Pat Patrick in July in July of this year, and at that time had deferred on surgical intervention. When the patient was seen here in August 2016 in follow-up of her breast cancer, at that time she had remained asymptomatic and declined surgical intervention. Abdominal ultrasound was November 20 2014. Bowels move daily and no bleeding. No history of jaundice. I personally reviewed the patient's history. HPI  Past Medical History  Diagnosis Date  . Hypertension 1985  . Thyroid disease 1996  . Macular degeneration 2013    white macular degeneration   . Hypothyroidism   . Hyperlipidemia   . Elevated blood sugar   . Malignant neoplasm of upper-outer quadrant of female breast [facility]) January 18, 2010    DCIS, 6 mm.core biopsy dated December 28, 2009 showed florid ductal hyperplasia with focal atypia. ER 90%, PR 90%..  . Skin cancer   . Breast cancer [facility]) 2011    Wide excision, radiation therapy, tamoxifen. Intermediate grade DCIS.    Past Surgical History  Procedure Laterality Date  . Colonoscopy  2011    Dr Vira Agar [facility]  . Eye surgery Left 2009  . Eye surgery Right 2010  . Abdominal hysterectomy  1968  . Breast biopsy Right 12/2009    Wide excision DCIS.,  Mammosite placement August 2011.    Family History  Problem Relation Age of Onset  . Breast cancer Sister 61  . Cancer Father     bone  . Heart failure Mother     Social History Social History  Substance Use Topics  . Smoking status: Never Smoker   . Smokeless tobacco: Never Used  . Alcohol Use: No    No Known Allergies  Current Outpatient Prescriptions  Medication Sig Dispense Refill  . aspirin 81 MG tablet Take 81 mg by mouth daily.    . B Complex-C-Folic Acid (STRESS FORMULA) TABS Take 1 tablet by mouth daily.    . benazepril (LOTENSIN) 40 MG tablet Take 40 mg by mouth daily.     . Cholecalciferol (VITAMIN D3) 2000 UNITS TABS Take 1 tablet by mouth daily.    Marland Kitchen doxazosin (CARDURA) 8 MG tablet Take 8 mg by mouth at bedtime.     . hydrochlorothiazide (HYDRODIURIL) 25 MG tablet Take 25 mg by mouth daily.     Marland Kitchen levothyroxine (SYNTHROID, LEVOTHROID) 50 MCG tablet Take 50 mcg by mouth daily before breakfast. Then 95mcg on Monday and Thursday    . Multiple Vitamins-Minerals (PRESERVISION AREDS 2 PO) Take 1 tablet by mouth 2 (two) times daily.     No current facility-administered medications for this visit.    Review of Systems Review of Systems  Constitutional: Negative. Appetite change: weight gain.  HENT: Negative.  Respiratory: Negative.  Cardiovascular:  Negative.  Gastrointestinal: Positive for nausea. Negative for vomiting.  Endocrine: Negative.  Genitourinary: Negative.  Allergic/Immunologic: Negative.  Hematological: Negative.    Blood pressure 144/74, pulse 88, resp. rate 16, height 5\' 6"  (1.676 m), weight 183 lb (83.008 kg). Patient's weight is up 9 pounds from August 2016, 7 pounds from July 2015.  Physical Exam Physical Exam  Constitutional: She appears well-developed and well-nourished.  HENT:  Mouth/Throat: Oropharynx is clear and moist.  Eyes: Conjunctivae are  normal. No scleral icterus.  Neck: Neck supple.  Cardiovascular: Normal rate, regular rhythm and normal heart sounds.  Pulses:  Femoral pulses are 2+ on the right side, and 2+ on the left side.  Dorsalis pedis pulses are 2+ on the right side, and 2+ on the left side.   Posterior tibial pulses are 2+ on the right side, and 2+ on the left side.  Pulmonary/Chest: Effort normal and breath sounds normal.  Abdominal: Soft. Normal appearance and bowel sounds are normal. She exhibits shifting dullness (Suggestion of shifting dullness, with only modest distention.). She exhibits no ascites. There is no tenderness.  Slight fullness in abdomen.  Lymphadenopathy:   She has no cervical adenopathy.  Neurological: She is alert.  Skin: Skin is warm and dry.  Psychiatric: Her behavior is normal.    Data Reviewed 11/20/2014: EXAM: US ABDOMEN LIMITED - RIGHT UPPER QUADRANT  COMPARISON: None.  FINDINGS: Gallbladder:  There are multiple echogenic mobile shadowing stones. The largest measures 1 cm in diameter. There is no gallbladder wall thickening, pericholecystic fluid, or positive sonographic Murphy's sign.  Common bile duct:  Diameter: 3.7 mm  Liver:  The hepatic echotexture is mildly increased diffusely. There is no focal mass or ductal dilation.  IMPRESSION: 1. Gallstones without evidence of acute cholecystitis. 2. Fatty infiltrative change of the liver.   Assessment    Gallstones, possibly symptomatic.    Plan    We'll review laboratory studies from her PCP when available.    Laparoscopic Cholecystectomy with Intraoperative Cholangiogram. The procedure, including it's potential risks and complications (including but not limited to infection, bleeding, injury to intra-abdominal organs or bile ducts, bile leak, poor cosmetic result, sepsis and death) were discussed with the patient in detail. Non-operative options, including their inherent risks (acute  calculous cholecystitis with possible choledocholithiasis or gallstone pancreatitis, with the risk of ascending cholangitis, sepsis, and death) were discussed as well. The patient expressed and understanding of what we discussed and wishes to proceed with laparoscopic cholecystectomy. The patient further understands that if it is technically not possible, or it is unsafe to proceed laparoscopically, that I will convert to an open cholecystectomy.  Patient's surgery has been scheduled for 05-28-15 at [facility]. It is okay for patient to continue 81 mg aspirin once daily.   PCP: Almetta Lovely

## 2015-05-19 NOTE — Patient Instructions (Addendum)
  Your procedure is scheduled on: 05-28-15 (FRIDAY) Report to [place] To find out your arrival time please call [phone] between 1PM - 3PM on 05-27-15 (THURSDAY)  Remember: Instructions that are not followed completely may result in serious medical risk, up to and including death, or upon the discretion of your surgeon and anesthesiologist your surgery may need to be rescheduled.    _X___ 1. Do not eat food or drink liquids after midnight. No gum chewing or hard candies.     _X___ 2. No Alcohol for 24 hours before or after surgery.   ____ 3. Bring all medications with you on the day of surgery if instructed.    _X___ 4. Notify your doctor if there is any change in your medical condition     (cold, fever, infections).     Do not wear jewelry, make-up, hairpins, clips or nail polish.  Do not wear lotions, powders, or perfumes. You may wear deodorant.  Do not shave 48 hours prior to surgery. Men may shave face and neck.  Do not bring valuables to the hospital.    [facility] is not responsible for any belongings or valuables.               Contacts, dentures or bridgework may not be worn into surgery.  Leave your suitcase in the car. After surgery it may be brought to your room.  For patients admitted to the hospital, discharge time is determined by your  treatment team.   Patients discharged the day of surgery will not be allowed to drive home.   Please read over the following fact sheets that you were given:      _X___ Take these medicines the morning of surgery with A SIP OF WATER:    1. BENAZEPRIL  2. LEVOTHYROXINE  3.   4.  5.  6.  ____ Fleet Enema (as directed)   _X___ Use CHG Soap as directed  ____ Use inhalers on the day of surgery  ____ Stop metformin 2 days prior to surgery    ____ Take 1/2 of usual insulin dose the night before surgery and none on the morning of surgery.   ____ Stop Coumadin/Plavix/aspirin-OK TO CONTINUE  81 MG ASPIRIN PER DR BYRNETT  ____ Stop Anti-inflammatories   _X___ Stop supplements until after surgery-STOP B COMPLEX AND PRESERVISION NOW   ____ Bring C-Pap to the hospital.

## 2015-05-21 ENCOUNTER — Encounter
Admission: RE | Admit: 2015-05-21 | Discharge: 2015-05-21 | Disposition: A | Discharge status: Home / Self Care | Payer: Medicare Other | Source: Ambulatory Visit | Attending: Anesthesiology | Admitting: Anesthesiology

## 2015-05-21 DIAGNOSIS — Z01812 Encounter for preprocedural laboratory examination: Secondary | ICD-10-CM | POA: Diagnosis not present

## 2015-05-21 DIAGNOSIS — Z01810 Encounter for preprocedural cardiovascular examination: Secondary | ICD-10-CM | POA: Diagnosis present

## 2015-05-21 LAB — COMPREHENSIVE METABOLIC PANEL
ALBUMIN: 3.8 g/dL (ref 3.5–5.0)
ALT: 46 U/L (ref 14–54)
ANION GAP: 10 (ref 5–15)
AST: 43 U/L — ABNORMAL HIGH (ref 15–41)
Alkaline Phosphatase: 39 U/L (ref 38–126)
BUN: 21 mg/dL — ABNORMAL HIGH (ref 6–20)
CALCIUM: 10.3 mg/dL (ref 8.9–10.3)
CHLORIDE: 102 mmol/L (ref 101–111)
CO2: 27 mmol/L (ref 22–32)
Creatinine, Ser: 0.9 mg/dL (ref 0.44–1.00)
GFR calc non Af Amer: 58 mL/min — ABNORMAL LOW (ref >60)
Glucose, Bld: 111 mg/dL — ABNORMAL HIGH (ref 65–99)
POTASSIUM: 3.1 mmol/L — AB (ref 3.5–5.1)
SODIUM: 139 mmol/L (ref 135–145)
Total Bilirubin: 0.6 mg/dL (ref 0.3–1.2)
Total Protein: 7.2 g/dL (ref 6.5–8.1)

## 2015-05-21 LAB — CBC WITH DIFFERENTIAL/PLATELET
BASOS PCT: 1 %
Basophils Absolute: 0.1 10*3/uL (ref 0–0.1)
EOS ABS: 0.2 10*3/uL (ref 0–0.7)
EOS PCT: 2 %
HCT: 36.1 % (ref 35.0–47.0)
Hemoglobin: 12.2 g/dL (ref 12.0–16.0)
LYMPHS ABS: 3.6 10*3/uL (ref 1.0–3.6)
Lymphocytes Relative: 33 %
MCH: 31.3 pg (ref 26.0–34.0)
MCHC: 33.7 g/dL (ref 32.0–36.0)
MCV: 93 fL (ref 80.0–100.0)
MONOS PCT: 9 %
Monocytes Absolute: 1 10*3/uL — ABNORMAL HIGH (ref 0.2–0.9)
Neutro Abs: 6.1 10*3/uL (ref 1.4–6.5)
Neutrophils Relative %: 55 %
PLATELETS: 199 10*3/uL (ref 150–440)
RBC: 3.89 MIL/uL (ref 3.80–5.20)
RDW: 13.4 % (ref 11.5–14.5)
WBC: 11 10*3/uL (ref 3.6–11.0)

## 2015-05-21 NOTE — Pre-Procedure Instructions (Signed)
K+3.1-faxed labs to Dr Curly Shores office-office closed at lunchtime today so I will call on Monday to make sure they received the fax

## 2015-05-24 ENCOUNTER — Telehealth: Payer: Self-pay | Admitting: General Surgery

## 2015-05-24 ENCOUNTER — Telehealth: Payer: Self-pay | Admitting: *Deleted

## 2015-05-24 NOTE — Pre-Procedure Instructions (Signed)
Fax received by Dr Curly Shores office regarding low potassium-Spoke with Threasa Beards at Dr Curly Shores office and Selinda Eon was in a room with a pt-I informed her that pt is now needing medical clearance due to abnormal EKG-Spoke with Dr Juanell Fairly office regarding clearance and I informed them that if pt needs to come in to be cleared then they need to call pt and let her know of appt- Office verbalized they would do this. Faxed clearance note and EKG to Dr Juanell Fairly office and Dr Dwyane Luo office.

## 2015-05-24 NOTE — Telephone Encounter (Signed)
Crystal Blanchard at Dr. Juanell Fairly office confirmed they received paperwork from the Pre-Admission Department requesting medical clearance. Office to decide if patient needs to come in for an appointment.   Surgery currently scheduled for 05-28-15 at [facility].   Office aware to fax clearance to our office.

## 2015-05-24 NOTE — Telephone Encounter (Signed)
-----   Message from Robert Bellow, MD sent at 05/24/2015  7:58 AM EST ----- Please contact patient: Needs to take KCL 20 mEq tablet 2 x/ day today and tomorrow. Call in 10.  Potassium is low, need it up before Friday's surgery. Thanks.

## 2015-05-24 NOTE — Telephone Encounter (Signed)
05-24-15 HEATHER FROM PRE-ADMIT CALLED & STATES MS. Giambalvo NEEDS CARDIAC CLEARANCE. HEATHER WILL CALL DR TATE'S OF[facility] & LET THEM KNOW OF THE ABNORMAL EKG & SEE IF THEY NEED TO SEE HER. IF YES,SHE WILL HAVE THEM CALL P. THE INFO TO BE FAXED TO OUR OF[facility]./MTH

## 2015-05-24 NOTE — Telephone Encounter (Signed)
Patient notified accordingly.  Prescription called in to [facility].

## 2015-05-26 ENCOUNTER — Encounter: Payer: Self-pay | Admitting: *Deleted

## 2015-05-26 NOTE — Pre-Procedure Instructions (Signed)
DR TATE CALLED TO SAY THAT HE WAS CLEARING PT FOR UPCOMING SURGERY ON Friday 12-16-HE STATED THAT  HE WAS FAXING OVER THE CLEARANCE NOTE THAT WE FAXED TO HIM BUT THAT TOMORROW A NOTE WOULD FOLLOW THAT CLEARANCE NOTE

## 2015-05-27 ENCOUNTER — Encounter: Payer: Self-pay | Admitting: *Deleted

## 2015-05-27 NOTE — Progress Notes (Signed)
Patient ID: Crystal Blanchard, female   DOB: 1931/08/01, 81 y.o.   MRN: [id number]  Medical clearance received from Dr. Juanell Fairly office. This was faxed to [facility] in [place].   We will proceed with surgery as scheduled for Friday, 05-28-15 at [facility].

## 2015-05-28 ENCOUNTER — Ambulatory Visit: Payer: Medicare Other

## 2015-05-28 ENCOUNTER — Ambulatory Visit
Admission: RE | Admit: 2015-05-28 | Discharge: 2015-05-28 | Disposition: A | Discharge status: Home / Self Care | Payer: Medicare Other | Source: Ambulatory Visit | Attending: General Surgery | Admitting: General Surgery

## 2015-05-28 ENCOUNTER — Ambulatory Visit: Payer: Medicare Other | Admitting: *Deleted

## 2015-05-28 ENCOUNTER — Encounter
Admission: RE | Disposition: A | Discharge status: Home / Self Care | Payer: Self-pay | Source: Ambulatory Visit | Attending: General Surgery

## 2015-05-28 ENCOUNTER — Encounter: Payer: Self-pay | Admitting: *Deleted

## 2015-05-28 DIAGNOSIS — Z7982 Long term (current) use of aspirin: Secondary | ICD-10-CM | POA: Diagnosis not present

## 2015-05-28 DIAGNOSIS — K8010 Calculus of gallbladder with chronic cholecystitis without obstruction: Secondary | ICD-10-CM | POA: Diagnosis not present

## 2015-05-28 DIAGNOSIS — Z853 Personal history of malignant neoplasm of breast: Secondary | ICD-10-CM | POA: Insufficient documentation

## 2015-05-28 DIAGNOSIS — Z923 Personal history of irradiation: Secondary | ICD-10-CM | POA: Insufficient documentation

## 2015-05-28 DIAGNOSIS — Z79899 Other long term (current) drug therapy: Secondary | ICD-10-CM | POA: Insufficient documentation

## 2015-05-28 DIAGNOSIS — E039 Hypothyroidism, unspecified: Secondary | ICD-10-CM | POA: Insufficient documentation

## 2015-05-28 DIAGNOSIS — M1990 Unspecified osteoarthritis, unspecified site: Secondary | ICD-10-CM | POA: Diagnosis not present

## 2015-05-28 DIAGNOSIS — E785 Hyperlipidemia, unspecified: Secondary | ICD-10-CM | POA: Insufficient documentation

## 2015-05-28 DIAGNOSIS — Z85828 Personal history of other malignant neoplasm of skin: Secondary | ICD-10-CM | POA: Diagnosis not present

## 2015-05-28 DIAGNOSIS — K8044 Calculus of bile duct with chronic cholecystitis without obstruction: Secondary | ICD-10-CM | POA: Diagnosis not present

## 2015-05-28 DIAGNOSIS — R110 Nausea: Secondary | ICD-10-CM | POA: Diagnosis not present

## 2015-05-28 DIAGNOSIS — I10 Essential (primary) hypertension: Secondary | ICD-10-CM | POA: Insufficient documentation

## 2015-05-28 DIAGNOSIS — K8020 Calculus of gallbladder without cholecystitis without obstruction: Secondary | ICD-10-CM

## 2015-05-28 DIAGNOSIS — K760 Fatty (change of) liver, not elsewhere classified: Secondary | ICD-10-CM | POA: Diagnosis not present

## 2015-05-28 HISTORY — DX: Dizziness and giddiness: R42

## 2015-05-28 HISTORY — DX: Unspecified osteoarthritis, unspecified site: M19.90

## 2015-05-28 HISTORY — PX: CHOLECYSTECTOMY: SHX55

## 2015-05-28 LAB — POTASSIUM: Potassium: 4.2 mmol/L (ref 3.5–5.1)

## 2015-05-28 SURGERY — LAPAROSCOPIC CHOLECYSTECTOMY WITH INTRAOPERATIVE CHOLANGIOGRAM
Anesthesia: General | Wound class: Clean Contaminated

## 2015-05-28 MED ORDER — SODIUM CHLORIDE 0.9 % IJ SOLN
INTRAMUSCULAR | Status: AC
  Filled 2015-05-28: qty 50

## 2015-05-28 MED ORDER — FAMOTIDINE 20 MG PO TABS
20.0000 mg | ORAL_TABLET | Freq: Once | ORAL | Status: AC
  Administered 2015-05-28: 20 mg via ORAL

## 2015-05-28 MED ORDER — ROCURONIUM BROMIDE 100 MG/10ML IV SOLN
INTRAVENOUS | Status: DC | PRN
  Administered 2015-05-28: 15 mg via INTRAVENOUS

## 2015-05-28 MED ORDER — KETOROLAC TROMETHAMINE 30 MG/ML IJ SOLN
INTRAMUSCULAR | Status: DC | PRN
  Administered 2015-05-28: 15 mg via INTRAVENOUS

## 2015-05-28 MED ORDER — PROPOFOL 10 MG/ML IV BOLUS
INTRAVENOUS | Status: DC | PRN
  Administered 2015-05-28: 150 mg via INTRAVENOUS

## 2015-05-28 MED ORDER — ACETAMINOPHEN 10 MG/ML IV SOLN
INTRAVENOUS | Status: AC
  Filled 2015-05-28: qty 100

## 2015-05-28 MED ORDER — SUCCINYLCHOLINE CHLORIDE 20 MG/ML IJ SOLN
INTRAMUSCULAR | Status: DC | PRN
Start: 2015-05-28 — End: 2015-05-28
  Administered 2015-05-28: 100 mg via INTRAVENOUS

## 2015-05-28 MED ORDER — LIDOCAINE HCL (CARDIAC) 20 MG/ML IV SOLN
INTRAVENOUS | Status: DC | PRN
  Administered 2015-05-28: 100 mg via INTRAVENOUS

## 2015-05-28 MED ORDER — HYDROMORPHONE HCL 1 MG/ML IJ SOLN: 0.2500 mg | INTRAMUSCULAR | Status: DC | PRN

## 2015-05-28 MED ORDER — HYDROCODONE-ACETAMINOPHEN 5-325 MG PO TABS: 1.0000 | ORAL_TABLET | ORAL | Status: DC | PRN

## 2015-05-28 MED ORDER — NEOSTIGMINE METHYLSULFATE 10 MG/10ML IV SOLN
INTRAVENOUS | Status: DC | PRN
  Administered 2015-05-28: 1 mg via INTRAVENOUS

## 2015-05-28 MED ORDER — LACTATED RINGERS IV SOLN
INTRAVENOUS | Status: DC
  Administered 2015-05-28 (×2): via INTRAVENOUS

## 2015-05-28 MED ORDER — FAMOTIDINE 20 MG PO TABS
ORAL_TABLET | ORAL | Status: AC
  Filled 2015-05-28: qty 1

## 2015-05-28 MED ORDER — ACETAMINOPHEN 10 MG/ML IV SOLN
INTRAVENOUS | Status: DC | PRN
  Administered 2015-05-28: 1000 mg via INTRAVENOUS

## 2015-05-28 MED ORDER — MIDAZOLAM HCL 2 MG/2ML IJ SOLN
INTRAMUSCULAR | Status: DC | PRN
  Administered 2015-05-28: 2 mg via INTRAVENOUS

## 2015-05-28 MED ORDER — GLYCOPYRROLATE 0.2 MG/ML IJ SOLN
INTRAMUSCULAR | Status: DC | PRN
  Administered 2015-05-28: 0.2 mg via INTRAVENOUS

## 2015-05-28 MED ORDER — CEFAZOLIN SODIUM-DEXTROSE 2-3 GM-% IV SOLR
2.0000 g | INTRAVENOUS | Status: AC
  Administered 2015-05-28: 2 g via INTRAVENOUS

## 2015-05-28 MED ORDER — CEFAZOLIN SODIUM-DEXTROSE 2-3 GM-% IV SOLR
INTRAVENOUS | Status: AC
  Filled 2015-05-28: qty 50

## 2015-05-28 MED ORDER — ONDANSETRON HCL 4 MG/2ML IJ SOLN
INTRAMUSCULAR | Status: DC | PRN
  Administered 2015-05-28: 4 mg via INTRAVENOUS

## 2015-05-28 MED ORDER — ONDANSETRON HCL 4 MG/2ML IJ SOLN
4.0000 mg | Freq: Once | INTRAMUSCULAR | Status: DC | PRN
Start: 2015-05-28 — End: 2015-05-28

## 2015-05-28 MED ORDER — FENTANYL CITRATE (PF) 100 MCG/2ML IJ SOLN
INTRAMUSCULAR | Status: DC | PRN
  Administered 2015-05-28: 100 ug via INTRAVENOUS

## 2015-05-28 MED ORDER — SODIUM CHLORIDE 0.9 % IV SOLN
INTRAVENOUS | Status: DC | PRN
  Administered 2015-05-28: 10 mL

## 2015-05-28 SURGICAL SUPPLY — 43 items
APPLIER CLIP ROT 10 11.4 M/L (STAPLE) ×3
BENZOIN TINCTURE PRP APPL 2/3 (GAUZE/BANDAGES/DRESSINGS) ×3 IMPLANT
BLADE SURG 11 STRL SS SAFETY (MISCELLANEOUS) ×3 IMPLANT
CANISTER SUCT 1200ML W/VALVE (MISCELLANEOUS) ×3 IMPLANT
CANNULA DILATOR  5MM W/SLV (CANNULA) ×2
CANNULA DILATOR 10 W/SLV (CANNULA) ×2 IMPLANT
CANNULA DILATOR 10MM W/SLV (CANNULA) ×1
CANNULA DILATOR 5 W/SLV (CANNULA) ×4 IMPLANT
CATH CHOLANG 76X19 KUMAR (CATHETERS) ×3 IMPLANT
CHLORAPREP W/TINT 26ML (MISCELLANEOUS) ×3 IMPLANT
CLIP APPLIE ROT 10 11.4 M/L (STAPLE) ×1 IMPLANT
CLOSURE WOUND 1/2 X4 (GAUZE/BANDAGES/DRESSINGS) ×1
CONRAY 60ML FOR OR (MISCELLANEOUS) ×3 IMPLANT
DISSECTOR KITTNER STICK (MISCELLANEOUS) ×1 IMPLANT
DISSECTORS/KITTNER STICK (MISCELLANEOUS) ×3
DRAPE SHEET LG 3/4 BI-LAMINATE (DRAPES) ×3 IMPLANT
DRESSING TELFA 4X3 1S ST N-ADH (GAUZE/BANDAGES/DRESSINGS) ×3 IMPLANT
DRSG TEGADERM 2-3/8X2-3/4 SM (GAUZE/BANDAGES/DRESSINGS) ×12 IMPLANT
DRSG TEGADERM 2X2.25 PEDS (GAUZE/BANDAGES/DRESSINGS) ×3 IMPLANT
DRSG TELFA 3X8 NADH (GAUZE/BANDAGES/DRESSINGS) ×3 IMPLANT
ENDOPOUCH RETRIEVER 10 (MISCELLANEOUS) ×3 IMPLANT
GLOVE BIO SURGEON STRL SZ7.5 (GLOVE) ×3 IMPLANT
GLOVE INDICATOR 8.0 STRL GRN (GLOVE) ×3 IMPLANT
GOWN STRL REUS W/ TWL LRG LVL3 (GOWN DISPOSABLE) ×3 IMPLANT
GOWN STRL REUS W/TWL LRG LVL3 (GOWN DISPOSABLE) ×6
IRRIGATION STRYKERFLOW (MISCELLANEOUS) ×1 IMPLANT
IRRIGATOR STRYKERFLOW (MISCELLANEOUS) ×3
IV LACTATED RINGERS 1000ML (IV SOLUTION) ×3 IMPLANT
KIT RM TURNOVER STRD PROC AR (KITS) ×3 IMPLANT
LABEL OR SOLS (LABEL) ×3 IMPLANT
NDL INSUFF ACCESS 14 VERSASTEP (NEEDLE) ×3 IMPLANT
NS IRRIG 500ML POUR BTL (IV SOLUTION) ×3 IMPLANT
PACK LAP CHOLECYSTECTOMY (MISCELLANEOUS) ×3 IMPLANT
PAD GROUND ADULT SPLIT (MISCELLANEOUS) ×3 IMPLANT
SCISSORS METZENBAUM CVD 33 (INSTRUMENTS) ×3 IMPLANT
SEAL FOR SCOPE WARMER C3101 (MISCELLANEOUS) ×3 IMPLANT
STRIP CLOSURE SKIN 1/2X4 (GAUZE/BANDAGES/DRESSINGS) ×2 IMPLANT
SUT VIC AB 0 CT2 27 (SUTURE) ×3 IMPLANT
SUT VIC AB 4-0 FS2 27 (SUTURE) ×3 IMPLANT
SWABSTK COMLB BENZOIN TINCTURE (MISCELLANEOUS) ×3 IMPLANT
TROCAR XCEL NON-BLD 11X100MML (ENDOMECHANICALS) ×3 IMPLANT
TUBING INSUFFLATOR HI FLOW (MISCELLANEOUS) ×3 IMPLANT
WATER STERILE IRR 1000ML POUR (IV SOLUTION) ×3 IMPLANT

## 2015-05-28 NOTE — Op Note (Signed)
Preoperative diagnosis: Chronic cholecystitis and cholelithiasis.  Postoperative diagnosis: Same.  Operative procedure: Laparoscopic cholecystectomy with intraoperative cholangiograms.  Operating surgeon Hervey Ard, M.D.  Anesthesia: Gen. endotracheal.  Estimated blood loss: Less than 5 mL.  Clinical note: This 81 year old woman has had episodic right upper quadrant pain. Ultrasound showed evidence of cholelithiasis. She is amenable to elective cholecystectomy for relief of symptoms.  Operative note: The patient received Kefzol prior the procedure and had SCD compression stockings for DVT prevention. After undergoing general endotracheal anesthesia the abdomen was prepped with ChloraPrep and draped. In Trendelenburg position a Mary's needle was placed through a trans-umbilical incision and after assuring intra-abdominal location with the hanging drop test the abdomen was insufflated with CO2 10 mmHg pressure. A 10 mm step port was expanded and inspection showed no evidence of injury from initial port placement. The patient was placed in reverse Trendelenburg position and rolled to the left. An 11 mm XL port was placed in the epigastrium and 25 mm step ports placed laterally. The gallbladder was distended, but showed minimal acute inflammation. This was decompressed and the gallbladder was placed on cephalad traction. There was more inflammatory changes in the neck of the gallbladder. The cystic duct was identified and a Kumar clamp placed. Fluoroscopic cholangiograms were completed using 10 mL of one half strength Conray 60. The dye refluxed into the common hepatic duct and into the common duct into the duodenum. No evidence of retained stones. Filling of the proximal hepatic ducts was incomplete. The cystic duct and cystic artery were doubly clipped and divided. The gallbladder was removed from the liver bed making use of hook cautery dissection. It was delivered to the umbilical port site.  Inspection of the right upper quadrant showed good hemostasis. This area was irrigated with lactated Ringer solution. The abdomen was then desufflated and ports removed under direct vision.  The fascia at the umbilicus was approximated with a single 0 Vicryl suture. Skin incisions were closed with 4-0 Vicryl septic sutures. Benzoin, Steri-Strips, Telfa and Tegaderm dressings were applied.  The patient tolerated the procedure well was taken to recovery in stable condition.

## 2015-05-28 NOTE — Anesthesia Procedure Notes (Signed)
Procedure Name: Intubation Date/Time: 05/28/2015 9:12 AM Performed by: Zetta Bills Pre-anesthesia Checklist: Patient identified, Emergency Drugs available, Suction available and Patient being monitored Patient Re-evaluated:Patient Re-evaluated prior to inductionOxygen Delivery Method: Circle system utilized Preoxygenation: Pre-oxygenation with 100% oxygen Intubation Type: IV induction Ventilation: Mask ventilation without difficulty Laryngoscope Size: Mac and 3 Grade View: Grade II Tube type: Oral Tube size: 7.0 mm Number of attempts: 1 Placement Confirmation: ETT inserted through vocal cords under direct vision Secured at: 21 cm Tube secured with: Tape Dental Injury: Teeth and Oropharynx as per pre-operative assessment

## 2015-05-28 NOTE — H&P (Signed)
Patient remains in good health. Seen by PCP earlier this week: No evidence of cardiac compromise. OK for surgery. Cardiopulmonary exam unchanged. For cholecystectomy.

## 2015-05-28 NOTE — Anesthesia Postprocedure Evaluation (Signed)
Anesthesia Post Note  Patient: Crystal Blanchard  Procedure(s) Performed: Procedure(s) (LRB): LAPAROSCOPIC CHOLECYSTECTOMY WITH INTRAOPERATIVE CHOLANGIOGRAM (N/A)  Patient location during evaluation: PACU Anesthesia Type: General Level of consciousness: awake Pain management: pain level controlled Vital Signs Assessment: post-procedure vital signs reviewed and stable Respiratory status: spontaneous breathing Cardiovascular status: blood pressure returned to baseline Postop Assessment: no headache Anesthetic complications: no    Last Vitals:  Filed Vitals:   05/28/15 1047 05/28/15 1100  BP: 173/73 174/67  Pulse: 57 61  Temp:  36.9 C  Resp: 15 12    Last Pain:  Filed Vitals:   05/28/15 1108  PainSc: 4                  Malyssa Maris M

## 2015-05-28 NOTE — Transfer of Care (Signed)
Immediate Anesthesia Transfer of Care Note  Patient: Crystal Blanchard  Procedure(s) Performed: Procedure(s): LAPAROSCOPIC CHOLECYSTECTOMY WITH INTRAOPERATIVE CHOLANGIOGRAM (N/A)  Patient Location: PACU  Anesthesia Type:General  Level of Consciousness: awake  Airway & Oxygen Therapy: Patient connected to nasal cannula oxygen  Post-op Assessment: Report given to RN  Post vital signs: stable  Last Vitals:  Filed Vitals:   05/28/15 1017 05/28/15 1026  BP: 190/81   Pulse: 70   Temp: 37.7 C 37.4 C  Resp: 14     Complications: No apparent anesthesia complications

## 2015-05-28 NOTE — Progress Notes (Signed)
Ice pack to ABD.

## 2015-05-28 NOTE — Anesthesia Preprocedure Evaluation (Addendum)
Anesthesia Evaluation  Patient identified by MRN, date of birth, ID band Patient awake    Reviewed: Allergy & Precautions, NPO status , Patient's Chart, lab work & pertinent test results  Airway Mallampati: III  TM Distance: >3 FB Neck ROM: Limited    Dental  (+) Teeth Intact   Pulmonary    Pulmonary exam normal        Cardiovascular Exercise Tolerance: Good hypertension, Pt. on medications Normal cardiovascular exam     Neuro/Psych    GI/Hepatic   Endo/Other  Hypothyroidism Treated.  Renal/GU      Musculoskeletal  (+) Arthritis , Osteoarthritis,    Abdominal (+)  Abdomen: soft.    Peds  Hematology   Anesthesia Other Findings   Reproductive/Obstetrics                            Anesthesia Physical Anesthesia Plan  ASA: II  Anesthesia Plan: General   Post-op Pain Management:    Induction: Intravenous  Airway Management Planned: Oral ETT  Additional Equipment:   Intra-op Plan:   Post-operative Plan: Extubation in OR  Informed Consent: I have reviewed the patients History and Physical, chart, labs and discussed the procedure including the risks, benefits and alternatives for the proposed anesthesia with the patient or authorized representative who has indicated his/her understanding and acceptance.     Plan Discussed with: CRNA  Anesthesia Plan Comments:         Anesthesia Quick Evaluation

## 2015-05-31 LAB — SURGICAL PATHOLOGY

## 2015-06-03 ENCOUNTER — Ambulatory Visit (INDEPENDENT_AMBULATORY_CARE_PROVIDER_SITE_OTHER): Payer: Medicare Other | Admitting: General Surgery

## 2015-06-03 VITALS — BP 140/70 | HR 78 | Resp 16 | Ht 66.0 in | Wt 183.0 lb

## 2015-06-03 DIAGNOSIS — K8020 Calculus of gallbladder without cholecystitis without obstruction: Secondary | ICD-10-CM

## 2015-06-03 NOTE — Progress Notes (Signed)
Patient ID: Crystal Blanchard, female   DOB: 04-26-1932, 81 y.o.   MRN: [id number]  Chief Complaint  Patient presents with  . Routine Post Op    gallbladder    HPI Crystal Blanchard is a 81 y.o. female here today for her post op gallbladder surgery done on 05/28/15. Patient states she is doing well.  I personally reviewed the patient's history.    Marland KitchenHPI  Past Medical History  Diagnosis Date  . Hypertension 1985  . Thyroid disease 1996  . Macular degeneration 2013    white macular degeneration-RECEIVES EYE INJECTIONS EVERY 5 WEEKS  . Hypothyroidism   . Hyperlipidemia   . Elevated blood sugar   . Malignant neoplasm of upper-outer quadrant of female breast [facility]) January 18, 2010    DCIS, 6 mm.core biopsy dated December 28, 2009 showed florid ductal hyperplasia with focal atypia. ER 90%, PR 90%..  . Skin cancer   . Breast cancer [facility]) 2011    Wide excision, radiation therapy, tamoxifen. Intermediate grade DCIS.  Marland Kitchen Vertigo   . Arthritis     Past Surgical History  Procedure Laterality Date  . Colonoscopy  2011    Dr Vira Agar [facility]  . Eye surgery Left 2009  . Eye surgery Right 2010  . Abdominal hysterectomy  1968  . Breast biopsy Right 12/2009    Wide excision DCIS., Mammosite placement August 2011.  Marland Kitchen Cholecystectomy N/A 05/28/2015    Procedure: LAPAROSCOPIC CHOLECYSTECTOMY WITH INTRAOPERATIVE CHOLANGIOGRAM;  Surgeon: Robert Bellow, MD;  Location: [facility] ORS;  Service: General;  Laterality: N/A;    Family History  Problem Relation Age of Onset  . Breast cancer Sister 26  . Cancer Father     bone  . Heart failure Mother     Social History Social History  Substance Use Topics  . Smoking status: Never Smoker   . Smokeless tobacco: Never Used  . Alcohol Use: No    No Known Allergies  Current Outpatient Prescriptions  Medication Sig Dispense Refill  . aspirin 81 MG tablet Take 81 mg by mouth daily.    . B Complex-C-Folic Acid (STRESS FORMULA) TABS Take 1 tablet by mouth  daily.    . benazepril (LOTENSIN) 40 MG tablet Take 40 mg by mouth every morning.     . Cholecalciferol (VITAMIN D3) 2000 UNITS TABS Take 1 tablet by mouth daily.    Marland Kitchen doxazosin (CARDURA) 8 MG tablet Take 8 mg by mouth at bedtime.     . hydrochlorothiazide (HYDRODIURIL) 25 MG tablet Take 25 mg by mouth daily.     Marland Kitchen HYDROcodone-acetaminophen (NORCO) 5-325 MG tablet Take 1-2 tablets by mouth every 4 (four) hours as needed for moderate pain. 30 tablet 0  . levothyroxine (SYNTHROID, LEVOTHROID) 50 MCG tablet Take 50 mcg by mouth daily before breakfast. Then 77mcg on Monday and Thursday    . Multiple Vitamins-Minerals (PRESERVISION AREDS 2 PO) Take 1 tablet by mouth 2 (two) times daily.     No current facility-administered medications for this visit.    Review of Systems Review of Systems  Blood pressure 140/70, pulse 78, resp. rate 16, height 5\' 6"  (1.676 m), weight 183 lb (83.008 kg).  Physical Exam Physical Exam  Constitutional: She is oriented to person, place, and time. She appears well-developed and well-nourished.  Cardiovascular: Normal rate, regular rhythm and normal heart sounds.   Pulmonary/Chest: Effort normal and breath sounds normal.  Abdominal:  Port sites are clean and healing well.   Neurological: She is  alert and oriented to person, place, and time.  Skin: Skin is warm and dry.    Data Reviewed Chronic cholecystitis and cholelithiasis.  Assessment    Doing well status post cholecystectomy.    Plan    The patient may resume her normal activities as she is comfortable. . Careful lifting technique described.    PCP:  Hall Busing, D This information has been scribed by Gaspar Cola CMA.     Robert Bellow 06/04/2015, 11:00 AM

## 2015-06-03 NOTE — Patient Instructions (Signed)
Patient to return as needed. 

## 2015-10-18 ENCOUNTER — Ambulatory Visit
Admission: RE | Admit: 2015-10-18 | Discharge: 2015-10-18 | Disposition: A | Discharge status: Home / Self Care | Payer: Medicare Other | Source: Ambulatory Visit | Attending: Internal Medicine | Admitting: Internal Medicine

## 2015-10-18 ENCOUNTER — Other Ambulatory Visit: Payer: Self-pay | Admitting: Internal Medicine

## 2015-10-18 DIAGNOSIS — R0600 Dyspnea, unspecified: Secondary | ICD-10-CM | POA: Insufficient documentation

## 2015-10-18 DIAGNOSIS — I517 Cardiomegaly: Secondary | ICD-10-CM | POA: Diagnosis not present

## 2015-10-18 DIAGNOSIS — I700 Atherosclerosis of aorta: Secondary | ICD-10-CM | POA: Insufficient documentation

## 2016-03-01 ENCOUNTER — Emergency Department: Payer: Medicare Other

## 2016-03-01 ENCOUNTER — Encounter: Payer: Self-pay | Admitting: *Deleted

## 2016-03-01 ENCOUNTER — Inpatient Hospital Stay
Admission: EM | Admit: 2016-03-01 | Discharge: 2016-03-03 | DRG: 316 | Disposition: A | Discharge status: Other Acute Inpatient Hospital | Payer: Medicare Other | Attending: Internal Medicine | Admitting: Internal Medicine

## 2016-03-01 ENCOUNTER — Inpatient Hospital Stay: Payer: Medicare Other

## 2016-03-01 ENCOUNTER — Inpatient Hospital Stay
Admit: 2016-03-01 | Discharge: 2016-03-01 | Disposition: A | Discharge status: Home / Self Care | Payer: Medicare Other | Attending: Internal Medicine | Admitting: Internal Medicine

## 2016-03-01 DIAGNOSIS — E785 Hyperlipidemia, unspecified: Secondary | ICD-10-CM | POA: Diagnosis present

## 2016-03-01 DIAGNOSIS — I3139 Other pericardial effusion (noninflammatory): Secondary | ICD-10-CM | POA: Diagnosis present

## 2016-03-01 DIAGNOSIS — I10 Essential (primary) hypertension: Secondary | ICD-10-CM | POA: Diagnosis present

## 2016-03-01 DIAGNOSIS — H3530 Unspecified macular degeneration: Secondary | ICD-10-CM | POA: Diagnosis present

## 2016-03-01 DIAGNOSIS — Z7982 Long term (current) use of aspirin: Secondary | ICD-10-CM | POA: Diagnosis not present

## 2016-03-01 DIAGNOSIS — Z85828 Personal history of other malignant neoplasm of skin: Secondary | ICD-10-CM

## 2016-03-01 DIAGNOSIS — I319 Disease of pericardium, unspecified: Secondary | ICD-10-CM | POA: Diagnosis not present

## 2016-03-01 DIAGNOSIS — Z803 Family history of malignant neoplasm of breast: Secondary | ICD-10-CM | POA: Diagnosis not present

## 2016-03-01 DIAGNOSIS — Z853 Personal history of malignant neoplasm of breast: Secondary | ICD-10-CM

## 2016-03-01 DIAGNOSIS — I313 Pericardial effusion (noninflammatory): Principal | ICD-10-CM | POA: Diagnosis present

## 2016-03-01 DIAGNOSIS — R072 Precordial pain: Secondary | ICD-10-CM | POA: Diagnosis present

## 2016-03-01 DIAGNOSIS — Z79899 Other long term (current) drug therapy: Secondary | ICD-10-CM

## 2016-03-01 DIAGNOSIS — Z923 Personal history of irradiation: Secondary | ICD-10-CM

## 2016-03-01 DIAGNOSIS — Z8249 Family history of ischemic heart disease and other diseases of the circulatory system: Secondary | ICD-10-CM | POA: Diagnosis not present

## 2016-03-01 DIAGNOSIS — R079 Chest pain, unspecified: Secondary | ICD-10-CM

## 2016-03-01 DIAGNOSIS — E039 Hypothyroidism, unspecified: Secondary | ICD-10-CM | POA: Diagnosis present

## 2016-03-01 DIAGNOSIS — R6889 Other general symptoms and signs: Secondary | ICD-10-CM | POA: Diagnosis not present

## 2016-03-01 DIAGNOSIS — I519 Heart disease, unspecified: Secondary | ICD-10-CM | POA: Diagnosis present

## 2016-03-01 LAB — BASIC METABOLIC PANEL
ANION GAP: 10 (ref 5–15)
BUN: 18 mg/dL (ref 6–20)
CHLORIDE: 104 mmol/L (ref 101–111)
CO2: 26 mmol/L (ref 22–32)
Calcium: 9.7 mg/dL (ref 8.9–10.3)
Creatinine, Ser: 1 mg/dL (ref 0.44–1.00)
GFR calc Af Amer: 60 mL/min — ABNORMAL LOW (ref >60)
GFR calc non Af Amer: 51 mL/min — ABNORMAL LOW (ref >60)
GLUCOSE: 187 mg/dL — AB (ref 65–99)
POTASSIUM: 3.5 mmol/L (ref 3.5–5.1)
Sodium: 140 mmol/L (ref 135–145)

## 2016-03-01 LAB — CREATININE, SERUM
Creatinine, Ser: 0.95 mg/dL (ref 0.44–1.00)
GFR, EST NON AFRICAN AMERICAN: 55 mL/min — AB (ref >60)

## 2016-03-01 LAB — CBC
HEMATOCRIT: 39 % (ref 35.0–47.0)
HEMATOCRIT: 38.1 % (ref 35.0–47.0)
HEMOGLOBIN: 13.6 g/dL (ref 12.0–16.0)
HEMOGLOBIN: 13.1 g/dL (ref 12.0–16.0)
MCH: 31.7 pg (ref 26.0–34.0)
MCH: 31.1 pg (ref 26.0–34.0)
MCHC: 34.9 g/dL (ref 32.0–36.0)
MCHC: 34.4 g/dL (ref 32.0–36.0)
MCV: 90.8 fL (ref 80.0–100.0)
MCV: 90.4 fL (ref 80.0–100.0)
Platelets: 224 10*3/uL (ref 150–440)
Platelets: 206 10*3/uL (ref 150–440)
RBC: 4.29 MIL/uL (ref 3.80–5.20)
RBC: 4.22 MIL/uL (ref 3.80–5.20)
RDW: 13.6 % (ref 11.5–14.5)
RDW: 13.5 % (ref 11.5–14.5)
WBC: 14.1 10*3/uL — ABNORMAL HIGH (ref 3.6–11.0)
WBC: 13.2 10*3/uL — AB (ref 3.6–11.0)

## 2016-03-01 LAB — TSH: TSH: 1.126 u[IU]/mL (ref 0.350–4.500)

## 2016-03-01 LAB — TROPONIN I: Troponin I: <0.03 ng/mL (ref <0.03)

## 2016-03-01 LAB — BRAIN NATRIURETIC PEPTIDE: B NATRIURETIC PEPTIDE 5: 76 pg/mL (ref 0.0–100.0)

## 2016-03-01 MED ORDER — ACETAMINOPHEN 325 MG PO TABS
650.0000 mg | ORAL_TABLET | Freq: Four times a day (QID) | ORAL | Status: DC | PRN
  Administered 2016-03-01 – 2016-03-02 (×2): 650 mg via ORAL
  Filled 2016-03-01 (×2): qty 2

## 2016-03-01 MED ORDER — HYDROCHLOROTHIAZIDE 25 MG PO TABS: 25.0000 mg | ORAL_TABLET | Freq: Every day | ORAL | Status: DC

## 2016-03-01 MED ORDER — METOPROLOL TARTRATE 25 MG PO TABS
25.0000 mg | ORAL_TABLET | Freq: Two times a day (BID) | ORAL | Status: DC
  Administered 2016-03-01 – 2016-03-03 (×5): 25 mg via ORAL
  Filled 2016-03-01 (×5): qty 1

## 2016-03-01 MED ORDER — ASPIRIN 81 MG PO CHEW
162.0000 mg | CHEWABLE_TABLET | Freq: Once | ORAL | Status: AC
  Administered 2016-03-01: 162 mg via ORAL
  Filled 2016-03-01: qty 2

## 2016-03-01 MED ORDER — DOXAZOSIN MESYLATE 4 MG PO TABS
8.0000 mg | ORAL_TABLET | Freq: Every day | ORAL | Status: DC
  Administered 2016-03-01 – 2016-03-02 (×2): 8 mg via ORAL
  Filled 2016-03-01 (×2): qty 2

## 2016-03-01 MED ORDER — ONDANSETRON HCL 4 MG PO TABS: 4.0000 mg | ORAL_TABLET | Freq: Four times a day (QID) | ORAL | Status: DC | PRN

## 2016-03-01 MED ORDER — METOPROLOL TARTRATE 5 MG/5ML IV SOLN: 5.0000 mg | INTRAVENOUS | Status: DC | PRN

## 2016-03-01 MED ORDER — ENOXAPARIN SODIUM 40 MG/0.4ML ~~LOC~~ SOLN
40.0000 mg | SUBCUTANEOUS | Status: DC
  Administered 2016-03-01 – 2016-03-02 (×2): 40 mg via SUBCUTANEOUS
  Filled 2016-03-01 (×2): qty 0.4

## 2016-03-01 MED ORDER — HYDRALAZINE HCL 20 MG/ML IJ SOLN: 10.0000 mg | Freq: Four times a day (QID) | INTRAMUSCULAR | Status: DC | PRN

## 2016-03-01 MED ORDER — B COMPLEX-C PO TABS
1.0000 | ORAL_TABLET | Freq: Every day | ORAL | Status: DC
  Administered 2016-03-02 – 2016-03-03 (×2): 1 via ORAL
  Filled 2016-03-01 (×2): qty 1

## 2016-03-01 MED ORDER — LEVOTHYROXINE SODIUM 75 MCG PO TABS
75.0000 ug | ORAL_TABLET | Freq: Every day | ORAL | Status: DC
  Administered 2016-03-02 – 2016-03-03 (×2): 75 ug via ORAL
  Filled 2016-03-01 (×2): qty 1

## 2016-03-01 MED ORDER — VITAMIN D 1000 UNITS PO TABS
2000.0000 [IU] | ORAL_TABLET | Freq: Every day | ORAL | Status: DC
  Administered 2016-03-02 – 2016-03-03 (×2): 2000 [IU] via ORAL
  Filled 2016-03-01 (×3): qty 2
  Filled 2016-03-01: qty 1

## 2016-03-01 MED ORDER — ACETAMINOPHEN 650 MG RE SUPP: 650.0000 mg | Freq: Four times a day (QID) | RECTAL | Status: DC | PRN

## 2016-03-01 MED ORDER — ASPIRIN EC 81 MG PO TBEC
81.0000 mg | DELAYED_RELEASE_TABLET | Freq: Every day | ORAL | Status: DC
  Administered 2016-03-02 – 2016-03-03 (×2): 81 mg via ORAL
  Filled 2016-03-01 (×2): qty 1

## 2016-03-01 MED ORDER — SENNOSIDES-DOCUSATE SODIUM 8.6-50 MG PO TABS: 1.0000 | ORAL_TABLET | Freq: Every evening | ORAL | Status: DC | PRN

## 2016-03-01 MED ORDER — SODIUM CHLORIDE 0.9% FLUSH
3.0000 mL | Freq: Two times a day (BID) | INTRAVENOUS | Status: DC
  Administered 2016-03-01 – 2016-03-03 (×4): 3 mL via INTRAVENOUS

## 2016-03-01 MED ORDER — OCUVITE-LUTEIN PO CAPS
ORAL_CAPSULE | Freq: Two times a day (BID) | ORAL | Status: DC
  Administered 2016-03-01 – 2016-03-03 (×4): 1 via ORAL
  Filled 2016-03-01 (×5): qty 1

## 2016-03-01 MED ORDER — IOPAMIDOL (ISOVUE-370) INJECTION 76%
75.0000 mL | Freq: Once | INTRAVENOUS | Status: AC | PRN
  Administered 2016-03-01: 75 mL via INTRAVENOUS

## 2016-03-01 MED ORDER — HYDROCODONE-ACETAMINOPHEN 5-325 MG PO TABS: 1.0000 | ORAL_TABLET | ORAL | Status: DC | PRN

## 2016-03-01 MED ORDER — ONDANSETRON HCL 4 MG/2ML IJ SOLN
4.0000 mg | Freq: Four times a day (QID) | INTRAMUSCULAR | Status: DC | PRN
  Administered 2016-03-02: 4 mg via INTRAVENOUS
  Filled 2016-03-01: qty 2

## 2016-03-01 MED ORDER — BENAZEPRIL HCL 20 MG PO TABS
40.0000 mg | ORAL_TABLET | ORAL | Status: DC
  Administered 2016-03-02 – 2016-03-03 (×2): 40 mg via ORAL
  Filled 2016-03-01 (×2): qty 2

## 2016-03-01 NOTE — ED Notes (Signed)
Informed RN bed ready 

## 2016-03-01 NOTE — ED Notes (Signed)
Pt to xray

## 2016-03-01 NOTE — ED Notes (Signed)
Pt return to ED room 2.

## 2016-03-01 NOTE — ED Triage Notes (Signed)
Pt arrived to ED reporting new onset of centralized chest pain beginning at 6:00 am this morning. Pt reports having SOB and lightheadedness as well as feeling chilled. Pt denies hx of heart problems. PT denies cough recently. Pain increases when talking a deep breath or with movement but pt denies increase in pain upon palpation.

## 2016-03-01 NOTE — ED Notes (Signed)
Pt to CT

## 2016-03-01 NOTE — ED Notes (Signed)
Hospitalist at bedside 

## 2016-03-01 NOTE — H&P (Addendum)
[place] [facility] [place] NAME: Crystal Blanchard    MR#:  [id number]  DATE OF BIRTH:  1931/07/18  DATE OF ADMISSION:  03/01/2016  PRIMARY CARE PHYSICIAN: Albina Billet, MD   REQUESTING/REFERRING PHYSICIAN: Dr. Jacqualine Code  CHIEF COMPLAINT:   Chest pain HISTORY OF PRESENT ILLNESS:  Crystal Blanchard  is a 82 y.o. female with a known history of Hypertension and hypothyroidism who presents with above complaint. Yeah patient was seen yesterday by Dr. Ubaldo Glassing due to shortness of breath and chest pain. She was recommended to get stress test which is scheduled for tomorrow. However due to recurrent chest pain she presents to the ER. In the emergency room CT of the chest was performed which showed a moderate to large pericardial effusion.  Cardiology was called and recommended admission to the hospital and according.    PAST MEDICAL HISTORY:   Past Medical History:  Diagnosis Date  . Arthritis   . Breast cancer [facility]) 2011   Wide excision, radiation therapy, tamoxifen. Intermediate grade DCIS.  Marland Kitchen Elevated blood sugar   . Hyperlipidemia   . Hypertension 1985  . Hypothyroidism   . Macular degeneration 2013   white macular degeneration-RECEIVES EYE INJECTIONS EVERY 5 WEEKS  . Malignant neoplasm of upper-outer quadrant of female breast [facility]) January 18, 2010   DCIS, 6 mm.core biopsy dated December 28, 2009 showed florid ductal hyperplasia with focal atypia. ER 90%, PR 90%..  . Skin cancer   . Thyroid disease 1996  . Vertigo     PAST SURGICAL HISTORY:   Past Surgical History:  Procedure Laterality Date  . ABDOMINAL HYSTERECTOMY  1968  . BREAST BIOPSY Right 12/2009   Wide excision DCIS., Mammosite placement August 2011.  . CHOLECYSTECTOMY N/A 05/28/2015   Procedure: LAPAROSCOPIC CHOLECYSTECTOMY WITH INTRAOPERATIVE CHOLANGIOGRAM;  Surgeon: Robert Bellow, MD;  Location: [facility] ORS;  Service: General;  Laterality: N/A;  . COLONOSCOPY  2011   Dr Vira Agar [facility]  . EYE  SURGERY Left 2009  . EYE SURGERY Right 2010    SOCIAL HISTORY:   Social History  Substance Use Topics  . Smoking status: Never Smoker  . Smokeless tobacco: Never Used  . Alcohol use No    FAMILY HISTORY:   Family History  Problem Relation Age of Onset  . Heart failure Mother   . Cancer Father     bone  . Breast cancer Sister 69    DRUG ALLERGIES:  No Known Allergies  REVIEW OF SYSTEMS:   Review of Systems  Constitutional: Negative.  Negative for chills, fever and malaise/fatigue.  HENT: Negative.  Negative for ear discharge, ear pain, hearing loss, nosebleeds and sore throat.   Eyes: Negative.  Negative for blurred vision and pain.  Respiratory: Positive for shortness of breath. Negative for cough, hemoptysis and wheezing.   Cardiovascular: Positive for chest pain and leg swelling. Negative for palpitations.  Gastrointestinal: Negative.  Negative for abdominal pain, blood in stool, diarrhea, nausea and vomiting.  Genitourinary: Negative.  Negative for dysuria.  Musculoskeletal: Negative.  Negative for back pain.  Skin: Negative.   Neurological: Negative for dizziness, tremors, speech change, focal weakness, seizures and headaches.  Endo/Heme/Allergies: Negative.  Does not bruise/bleed easily.  Psychiatric/Behavioral: Negative.  Negative for depression, hallucinations and suicidal ideas.    MEDICATIONS AT HOME:   Prior to Admission medications   Medication Sig Start Date End Date Taking? Authorizing Provider  aspirin 81 MG tablet Take 81 mg by mouth daily.  Yes Historical Provider, MD  B Complex-C-Folic Acid (STRESS FORMULA) TABS Take 1 tablet by mouth daily.   Yes Historical Provider, MD  benazepril (LOTENSIN) 40 MG tablet Take 40 mg by mouth every morning.  12/09/12  Yes Historical Provider, MD  Cholecalciferol (VITAMIN D3) 2000 UNITS TABS Take 1 tablet by mouth daily.   Yes Historical Provider, MD  doxazosin (CARDURA) 8 MG tablet Take 8 mg by mouth at bedtime.   12/09/12  Yes Historical Provider, MD  levothyroxine (SYNTHROID, LEVOTHROID) 75 MCG tablet Take 75 mcg by mouth daily before breakfast.   Yes Historical Provider, MD  Multiple Vitamins-Minerals (PRESERVISION AREDS 2 PO) Take 1 tablet by mouth 2 (two) times daily.   Yes Historical Provider, MD  hydrochlorothiazide (HYDRODIURIL) 25 MG tablet Take 25 mg by mouth daily.  12/09/12   Historical Provider, MD  HYDROcodone-acetaminophen (NORCO) 5-325 MG tablet Take 1-2 tablets by mouth every 4 (four) hours as needed for moderate pain. Patient not taking: Reported on 03/01/2016 05/28/15   Robert Bellow, MD      VITAL SIGNS:  Blood pressure (!) 193/81, pulse 83, temperature 98.7 F (37.1 C), temperature source Oral, resp. rate 16, height 5\' 6"  (1.676 m), weight 79.4 kg (175 lb), SpO2 96 %.  PHYSICAL EXAMINATION:   Physical Exam  Constitutional: She is oriented to person, place, and time and well-developed, well-nourished, and in no distress. No distress.  HENT:  Head: Normocephalic.  Eyes: No scleral icterus.  Neck: Normal range of motion. Neck supple. No JVD present. No tracheal deviation present.  Cardiovascular: Normal rate, regular rhythm and normal heart sounds.  Exam reveals no gallop and no friction rub.   No murmur heard. No muffled heart sounds  Pulmonary/Chest: Effort normal and breath sounds normal. No respiratory distress. She has no wheezes. She has no rales. She exhibits no tenderness.  Abdominal: Soft. Bowel sounds are normal. She exhibits no distension and no mass. There is no tenderness. There is no rebound and no guarding.  Musculoskeletal: Normal range of motion. She exhibits edema (left leg).  Neurological: She is alert and oriented to person, place, and time.  Skin: Skin is warm. No rash noted. No erythema.  Psychiatric: Affect and judgment normal.      LABORATORY PANEL:   CBC  Recent Labs Lab 03/01/16 1226  WBC 14.1*  HGB 13.6  HCT 39.0  PLT 224    ------------------------------------------------------------------------------------------------------------------  Chemistries   Recent Labs Lab 03/01/16 1226  NA 140  K 3.5  CL 104  CO2 26  GLUCOSE 187*  BUN 18  CREATININE 1.00  CALCIUM 9.7   ------------------------------------------------------------------------------------------------------------------  Cardiac Enzymes  Recent Labs Lab 03/01/16 1226  TROPONINI <0.03   ------------------------------------------------------------------------------------------------------------------  RADIOLOGY:  Dg Chest 2 View  Result Date: 03/01/2016 CLINICAL DATA:  Centralized chest pain and weakness beginning at 6 a.m. this morning. History of breast carcinoma. EXAM: CHEST  2 VIEW COMPARISON:  PA and lateral chest 10/18/2015. FINDINGS: The cardiopericardial silhouette is larger than on the comparison study worrisome for pericardial effusion superimposed on cardiomegaly. The lungs are clear. No pulmonary edema. Aortic atherosclerosis is noted. No pleural effusion is seen. No focal bony abnormality. IMPRESSION: Enlargement of the cardiopericardial silhouette since the comparison study worrisome for pericardial effusion superimposed on cardiomegaly. Clear lungs. Atherosclerosis. Electronically Signed   By: Inge Rise M.D.   On: 03/01/2016 12:57   Ct Angio Chest Pe W Or Wo Contrast  Result Date: 03/01/2016 CLINICAL DATA:  Chest pain today, bilateral leg  swelling, history of breast carcinoma EXAM: CT ANGIOGRAPHY CHEST WITH CONTRAST TECHNIQUE: Multidetector CT imaging of the chest was performed using the standard protocol during bolus administration of intravenous contrast. Multiplanar CT image reconstructions and MIPs were obtained to evaluate the vascular anatomy. CONTRAST:  75 cc Isovue 370 COMPARISON:  Chest x-ray of 03/01/2016 FINDINGS: Cardiovascular: The pulmonary arteries are well opacified. There is no evidence of acute pulmonary  embolism. The thoracic aorta also is well opacified with no acute abnormality. Moderate thoracic aortic atherosclerosis is noted. Those also cardiomegaly present. A moderate size to large pericardial effusion is present. Mediastinum/Nodes: Only small mediastinal lymph nodes are present. No mediastinal or hilar adenopathy is seen. Lungs/Pleura: On lung window images, there is linear atelectasis or scarring in the left lower lobe. Some this may be due to compressive atelectasis from the adjacent cardiomegaly and pericardial effusion. No pleural effusion is seen. Upper Abdomen: The upper abdomen is unremarkable. Musculoskeletal: There are degenerative changes throughout the thoracic spine. No compression deformity is seen. Review of the MIP images confirms the above findings. IMPRESSION: 1. Moderate to large pericardial effusion.  Cardiomegaly. 2. No evidence of acute pulmonary embolism. 3. Moderate thoracic aortic atherosclerosis. 4. Probable compressive atelectasis at the left lower lobe as result of cardiomegaly and pericardial effusion. Electronically Signed   By: Ivar Drape M.D.   On: 03/01/2016 14:14    EKG:   Normal sinus rhythm no ST elevation or depression.  IMPRESSION AND PLAN:    82 year old female with a history of hypertension and hyperlipidemia who presents with chest pain and shortness of breath and found to have moderate to large pericardial effusion on CT.  1. Moderate to large pericardial effusion: Patient will need echocardiogram. Patient has no symptoms of temporal not at this time. Patient needs telemetry monitoring.  Patient will need cardiology consultation for further evaluation.  2. Accelerated hypertension on essential hypertension: Start metoprolol 25 mg by mouth twice a day. When necessary hydralazine and metoprolol have been ordered. Patient is to continue her outpatient dose of ACE inhibitor, Cardura  3. Hypothyroidism: Continue Synthroid and check TSH due to problem  #1.     All the records are reviewed and case discussed with ED provider. Management plans discussed with the patient and she in agreement  CODE STATUS: full  TOTAL TIME TAKING CARE OF THIS PATIENT: 45 minutes.    Dreya Buhrman M.D on 03/01/2016 at 2:56 PM  Between 7am to 6pm - Pager - [phone]  After 6pm go to [email or web address] - password [email or web address]  Office  [phone]  CC: Primary care physician; Albina Billet, MD

## 2016-03-01 NOTE — ED Provider Notes (Signed)
[facility] Emergency Department Provider Note   ____________________________________________   First MD Initiated Contact with Patient 03/01/16 1254     (approximate)  I have reviewed the triage vital signs and the nursing notes.   HISTORY  Chief Complaint Chest Pain    HPI Crystal Blanchard is a 82 y.o. female presents for evaluation of chest pain. Patient reports when she got up about 6:00 this morning she began developing a severe pain in the middle of her chest that felt like a heaviness. It eased off after several minutes, and throughout the day she has been noticing occurrences work comes back sometimes feels sharp, but most often a heaviness in the mid chest. No abdominal pain. No nausea or vomiting. Does not radiate or move to the back.  Patient also reports she knows her legs and all swollen recently, and saw Dr. Ubaldo Glassing who recommended she get a stress test yesterday  No personal history of coronary disease that she is aware of. Never had a heart attack.  Moderate substernal chest pain. Currently no pain. Took aspirin at home  Past Medical History:  Diagnosis Date  . Arthritis   . Breast cancer [facility]) 2011   Wide excision, radiation therapy, tamoxifen. Intermediate grade DCIS.  Marland Kitchen Elevated blood sugar   . Hyperlipidemia   . Hypertension 1985  . Hypothyroidism   . Macular degeneration 2013   white macular degeneration-RECEIVES EYE INJECTIONS EVERY 5 WEEKS  . 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 ER 90%, PR 90%..  . Skin cancer   . Thyroid disease 1996  . Vertigo     Patient Active Problem List   Diagnosis Date Noted  . Pericardial effusion 03/01/2016  . Calculus of gallbladder without cholecystitis without obstruction 02/01/2015  . History of breast cancer 01/28/2013    Past Surgical History:    Procedure Laterality Date  . ABDOMINAL HYSTERECTOMY  1968  . BREAST BIOPSY Right 12/2009   Wide excision DCIS., Mammosite placement August 2011.  . CHOLECYSTECTOMY N/A 05/28/2015   Procedure: LAPAROSCOPIC CHOLECYSTECTOMY WITH INTRAOPERATIVE CHOLANGIOGRAM;  Surgeon: Robert Bellow, MD;  Location: [facility] ORS;  Service: General;  Laterality: N/A;  . COLONOSCOPY  2011   Dr Vira Agar [facility]  . EYE SURGERY Left 2009  . EYE SURGERY Right 2010    Prior to Admission medications   Medication Sig Start Date End Date Taking? Authorizing Provider  aspirin 81 MG tablet Take 81 mg by mouth daily.   Yes Historical Provider, MD  B Complex-C-Folic Acid (STRESS FORMULA) TABS Take 1 tablet by mouth daily.   Yes Historical Provider, MD  benazepril (LOTENSIN) 40 MG tablet Take 40 mg by mouth every morning.  12/09/12  Yes Historical Provider, MD  Cholecalciferol (VITAMIN D3) 2000 UNITS TABS Take 1 tablet by mouth daily.   Yes Historical Provider, MD  doxazosin (CARDURA) 8 MG tablet Take 8 mg by mouth at bedtime.  12/09/12  Yes Historical Provider, MD  levothyroxine (SYNTHROID, LEVOTHROID) 75 MCG tablet Take 75 mcg by mouth daily before breakfast.   Yes Historical Provider, MD  Multiple Vitamins-Minerals (PRESERVISION AREDS 2 PO) Take 1 tablet by mouth 2 (two) times daily.   Yes Historical Provider, MD  hydrochlorothiazide (HYDRODIURIL) 25 MG tablet Take 25 mg by mouth daily.  12/09/12   Historical Provider, MD  HYDROcodone-acetaminophen (NORCO) 5-325 MG tablet Take 1-2  tablets by mouth every 4 (four) hours as needed for moderate pain. Patient not taking: Reported on 03/01/2016 05/28/15   Robert Bellow, MD    Allergies Review of patient's allergies indicates no known allergies.  Family History  Problem Relation Age of Onset  . Heart failure Mother   . Cancer Father     bone  . Breast cancer Sister 51    Social History Social History  Substance Use Topics  . Smoking status: Never Smoker  . Smokeless  tobacco: Never Used  . Alcohol use No    Review of Systems Constitutional: No feverFelt slightly chilled today Eyes: No visual changes. ENT: No sore throat. Cardiovascular: See history of present illness Respiratory: Some slight shortness of breath over the last couple days. No shortness of breath with laying flat. Gastrointestinal: No abdominal pain.  No nausea, no vomiting.  No diarrhea.  No constipation. Genitourinary: Negative for dysuria. Musculoskeletal: Negative for back pain. Skin: Negative for rash. Neurological: Negative for headaches, focal weakness or numbness.  10-point ROS otherwise negative.  ____________________________________________   PHYSICAL EXAM:  VITAL SIGNS: ED Triage Vitals  Enc Vitals Group     BP 03/01/16 1218 (!) 133/58     Pulse Rate 03/01/16 1218 95     Resp 03/01/16 1218 16     Temp 03/01/16 1218 98.7 F (37.1 C)     Temp Source 03/01/16 1218 Oral     SpO2 03/01/16 1218 94 %     Weight 03/01/16 1217 175 lb (79.4 kg)     Height 03/01/16 1217 5\' 6"  (1.676 m)     Head Circumference --      Peak Flow --      Pain Score 03/01/16 1218 9     Pain Loc --      Pain Edu? --      Excl. in [place]? --     Constitutional: Alert and oriented. Well appearing and in no acute distress. Eyes: Conjunctivae are normal. PERRL. EOMI. Head: Atraumatic. Nose: No congestion/rhinnorhea. Mouth/Throat: Mucous membranes are moist.  Oropharynx non-erythematous. Neck: No stridor.   Cardiovascular: Normal rate, regular rhythm. Grossly normal heart sounds.  Good peripheral circulation. Respiratory: Normal respiratory effort.  No retractions. Lungs CTAB. Gastrointestinal: Soft and nontender. No distention. No abdominal bruits. No CVA tenderness. Musculoskeletal: No lower extremity tenderness 1+ plus edema is noted, slightly more on the left than on the right.  No joint effusions. Neurologic:  Normal speech and language. No gross focal neurologic deficits are appreciated.  No gait instability. Skin:  Skin is warm, dry and intact. No rash noted. Psychiatric: Mood and affect are normal. Speech and behavior are normal.  ____________________________________________   LABS (all labs ordered are listed, but only abnormal results are displayed)  Labs Reviewed  BASIC METABOLIC PANEL - Abnormal; Notable for the following:       Result Value   Glucose, Bld 187 (*)    GFR calc non Af Amer 51 (*)    GFR calc Af Amer 60 (*)    All other components within normal limits  CBC - Abnormal; Notable for the following:    WBC 14.1 (*)    All other components within normal limits  TROPONIN I  BRAIN NATRIURETIC PEPTIDE  CBC  CREATININE, SERUM  TSH   ____________________________________________  EKG  ED ECG REPORT I, Woodard Perrell, the attending physician, personally viewed and interpreted this ECG.  Date: 03/01/2016 EKG Time: 1210 Rate: 90 Rhythm: normal sinus rhythm QRS  Axis: normal Intervals: normal ST/T Wave abnormalities: normal Conduction Disturbances: none Narrative Interpretation: unremarkable  ____________________________________________  RADIOLOGY  Dg Chest 2 View  Result Date: 03/01/2016 CLINICAL DATA:  Centralized chest pain and weakness beginning at 6 a.m. this morning. History of breast carcinoma. EXAM: CHEST  2 VIEW COMPARISON:  PA and lateral chest 10/18/2015. FINDINGS: The cardiopericardial silhouette is larger than on the comparison study worrisome for pericardial effusion superimposed on cardiomegaly. The lungs are clear. No pulmonary edema. Aortic atherosclerosis is noted. No pleural effusion is seen. No focal bony abnormality. IMPRESSION: Enlargement of the cardiopericardial silhouette since the comparison study worrisome for pericardial effusion superimposed on cardiomegaly. Clear lungs. Atherosclerosis. Electronically Signed   By: Inge Rise M.D.   On: 03/01/2016 12:57   Ct Angio Chest Pe W Or Wo Contrast  Result Date:  03/01/2016 CLINICAL DATA:  Chest pain today, bilateral leg swelling, history of breast carcinoma EXAM: CT ANGIOGRAPHY CHEST WITH CONTRAST TECHNIQUE: Multidetector CT imaging of the chest was performed using the standard protocol during bolus administration of intravenous contrast. Multiplanar CT image reconstructions and MIPs were obtained to evaluate the vascular anatomy. CONTRAST:  75 cc Isovue 370 COMPARISON:  Chest x-ray of 03/01/2016 FINDINGS: Cardiovascular: The pulmonary arteries are well opacified. There is no evidence of acute pulmonary embolism. The thoracic aorta also is well opacified with no acute abnormality. Moderate thoracic aortic atherosclerosis is noted. Those also cardiomegaly present. A moderate size to large pericardial effusion is present. Mediastinum/Nodes: Only small mediastinal lymph nodes are present. No mediastinal or hilar adenopathy is seen. Lungs/Pleura: On lung window images, there is linear atelectasis or scarring in the left lower lobe. Some this may be due to compressive atelectasis from the adjacent cardiomegaly and pericardial effusion. No pleural effusion is seen. Upper Abdomen: The upper abdomen is unremarkable. Musculoskeletal: There are degenerative changes throughout the thoracic spine. No compression deformity is seen. Review of the MIP images confirms the above findings. IMPRESSION: 1. Moderate to large pericardial effusion.  Cardiomegaly. 2. No evidence of acute pulmonary embolism. 3. Moderate thoracic aortic atherosclerosis. 4. Probable compressive atelectasis at the left lower lobe as result of cardiomegaly and pericardial effusion. Electronically Signed   By: Ivar Drape M.D.   On: 03/01/2016 14:14   US Venous Img Lower Bilateral  Result Date: 03/01/2016 CLINICAL DATA:  Bilateral lower extremity edema. EXAM: BILATERAL LOWER EXTREMITY VENOUS DOPPLER ULTRASOUND TECHNIQUE: Gray-scale sonography with graded compression, as well as color Doppler and duplex ultrasound  were performed to evaluate the lower extremity deep venous systems from the level of the common femoral vein and including the common femoral, femoral, profunda femoral, popliteal and calf veins including the posterior tibial, peroneal and gastrocnemius veins when visible. The superficial great saphenous vein was also interrogated. Spectral Doppler was utilized to evaluate flow at rest and with distal augmentation maneuvers in the common femoral, femoral and popliteal veins. COMPARISON:  None. FINDINGS: RIGHT LOWER EXTREMITY Normal compressibility, augmentation and color Doppler flow in the right common femoral vein, right femoral vein and right popliteal vein. The right saphenofemoral junction is patent. Right profunda femoral vein is patent without thrombus. Visualized right deep calf veins are patent without thrombus. LEFT LOWER EXTREMITY Normal compressibility, augmentation and color Doppler flow in the left common femoral vein, left femoral vein and left popliteal vein. The left saphenofemoral junction is patent. Left profunda femoral vein is patent without thrombus. Visualized left deep calf veins are patent without thrombus. Other Findings:  None. IMPRESSION: No evidence of deep  venous thrombosis in the lower extremities. Electronically Signed   By: Markus Daft M.D.   On: 03/01/2016 16:21    ____________________________________________   PROCEDURES  Procedure(s) performed: None  Procedures  Critical Care performed: No  ____________________________________________   INITIAL IMPRESSION / ASSESSMENT AND PLAN / ED COURSE  Pertinent labs & imaging results that were available during my care of the patient were reviewed by me and considered in my medical decision making (see chart for details).  Transfer chest pain, substernal in nature. Patient felt to be at moderate risk for coronary disease based on her age and presentation. Currently not having pain, but was having fairly significant  substernal chest pressure earlier. Discussed with her cardiologist, Dr. Ubaldo Glassing and he recommends that the patient could be admitted for observation and a stress test. He noted that it ordered a stress test for her in the clinic yesterday. Discussed with the patient and she is agreeable, however prior to admitting her him also working her up for possible thromboembolic disease given lower leg swelling, slightly worse in the left which could represent a DVT, in addition given her associated chest pain CT angiography will be performed primarily directed as a PE study, though certainly dissection could also be considered, however given associated dyspnea that she's had for the last couple of days preceding this and more concern for possible pulmonary wasn't  Clinical Course    ----------------------------------------- 1:50 PM on 03/01/2016 -----------------------------------------  Patient pain-free. Resting comfortably.Moderate to large effusion discussed with Dr. Ubaldo Glassing, who advised admission and echo. Sting comfortably. Hemodynamically stable ____________________________________________   FINAL CLINICAL IMPRESSION(S) / ED DIAGNOSES  Final diagnoses:  Chest pain  Moderate risk chest pain  Pericardial effusion      NEW MEDICATIONS STARTED DURING THIS VISIT:  Current Discharge Medication List       Note:  This document was prepared using Dragon voice recognition software and may include unintentional dictation errors.     Delman Kitten, MD 03/01/16 [phone]

## 2016-03-01 NOTE — ED Notes (Signed)
Pt to radiology for US

## 2016-03-01 NOTE — Consult Note (Signed)
[place]  CARDIOLOGY CONSULT NOTE  Patient ID: Crystal Blanchard MRN: [id number] DOB/AGE: 1931/09/02 82 y.o.  Admit date: 03/01/2016 Referring Physician Dr. Jacqualine Code Primary Physician   Primary Cardiologist Dr. Ubaldo Glassing Reason for Consultation Chest pain pericardial  HPI: Patient is an 82 year old female with history of breast cancer status post wide excision with radiation therapy including tamoxifen, history of hyperlipidemia hypertension who had a history of a trivial pericardial effusion by echocardiogram 3 months ago. She presented to her office yesterday with chest discomfort. She denies shortness of breath. She was scheduled for a functional study in our office later. She awoke from sleep today with chest heaviness. It did not necessarily worsen with activity or position change. Persisted and she presented to the emergency room. EKG showed no ischemia. Patient's serum troponin was normal. She had chest CT which revealed moderate to large pericardial effusion. She was admitted for further evaluation of this and to rule out coronary artery disease as etiology of her symptoms. She is currently laying flat in bed. Her blood pressure is stable. She has no tachycardia.   Review of Systems  Constitutional: Negative.   HENT: Negative.   Eyes: Negative.   Respiratory: Negative.   Cardiovascular: Positive for chest pain.  Gastrointestinal: Negative.   Genitourinary: Negative.   Musculoskeletal: Negative.   Skin: Negative.   Neurological: Negative.   Endo/Heme/Allergies: Negative.   Psychiatric/Behavioral: Negative.     Past Medical History:  Diagnosis Date  . Arthritis   . Breast cancer [facility]) 2011   Wide excision, radiation therapy, tamoxifen. Intermediate grade DCIS.  Marland Kitchen Elevated blood sugar   . Hyperlipidemia   . Hypertension 1985  . Hypothyroidism   . Macular degeneration 2013   white macular degeneration-RECEIVES EYE INJECTIONS EVERY 5 WEEKS  .  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 ER 90%, PR 90%..  . Skin cancer   . Thyroid disease 1996  . Vertigo     Family History  Problem Relation Age of Onset  . Heart failure Mother   . Cancer Father     bone  . Breast cancer Sister 42    Social History   Social History  . Marital status: Married    Spouse name: N/A  . Number of children: N/A  . Years of education: N/A   Occupational History  . Not on file.   Social History Main Topics  . Smoking status: Never Smoker  . Smokeless tobacco: Never Used  . Alcohol use No  . Drug use: No  . Sexual activity: Not on file   Other Topics Concern  . Not on file   Social History Narrative  . No narrative on file    Past Surgical History:  Procedure Laterality Date  . ABDOMINAL HYSTERECTOMY  1968  . BREAST BIOPSY Right 12/2009   Wide excision DCIS., Mammosite placement August 2011.  . CHOLECYSTECTOMY N/A 05/28/2015   Procedure: LAPAROSCOPIC CHOLECYSTECTOMY WITH INTRAOPERATIVE CHOLANGIOGRAM;  Surgeon: Robert Bellow, MD;  Location: [facility] ORS;  Service: General;  Laterality: N/A;  . COLONOSCOPY  2011   Dr Vira Agar [facility]  . EYE SURGERY Left 2009  . EYE SURGERY Right 2010     Prescriptions Prior to Admission  Medication Sig Dispense Refill Last Dose  . aspirin 81 MG tablet Take 81 mg by mouth daily.   03/01/2016 at  0630  . B Complex-C-Folic Acid (STRESS FORMULA) TABS Take 1 tablet by mouth daily.   03/01/2016 at 0630  . benazepril (LOTENSIN) 40 MG tablet Take 40 mg by mouth every morning.    03/01/2016 at 0630  . Cholecalciferol (VITAMIN D3) 2000 UNITS TABS Take 1 tablet by mouth daily.   03/01/2016 at 0630  . doxazosin (CARDURA) 8 MG tablet Take 8 mg by mouth at bedtime.    02/29/2016 at 2130  . levothyroxine (SYNTHROID, LEVOTHROID) 75 MCG tablet Take 75 mcg by mouth daily before breakfast.    03/01/2016 at 0630  . Multiple Vitamins-Minerals (PRESERVISION AREDS 2 PO) Take 1 tablet by mouth 2 (two) times daily.   03/01/2016 at 0630  . hydrochlorothiazide (HYDRODIURIL) 25 MG tablet Take 25 mg by mouth daily.    Not Taking at Unknown time  . HYDROcodone-acetaminophen (NORCO) 5-325 MG tablet Take 1-2 tablets by mouth every 4 (four) hours as needed for moderate pain. (Patient not taking: Reported on 03/01/2016) 30 tablet 0 Not Taking at Unknown time    Physical Exam: Blood pressure (!) 183/68, pulse 77, temperature 98.7 F (37.1 C), temperature source Oral, resp. rate 18, height 5\' 6"  (1.676 m), weight 77.7 kg (171 lb 3.2 oz), SpO2 92 %.   Wt Readings from Last 1 Encounters:  03/01/16 77.7 kg (171 lb 3.2 oz)     General appearance: alert and cooperative Head: Normocephalic, without obvious abnormality, atraumatic Chest wall: no tenderness Cardio: regular rate and rhythm GI: soft, non-tender; bowel sounds normal; no masses,  no organomegaly Extremities: extremities normal, atraumatic, no cyanosis or edema Neurologic: Grossly normal  Labs:   Lab Results  Component Value Date   WBC 14.1 (H) 03/01/2016   HGB 13.6 03/01/2016   HCT 39.0 03/01/2016   MCV 90.8 03/01/2016   PLT 224 03/01/2016    Recent Labs Lab 03/01/16 1226  NA 140  K 3.5  CL 104  CO2 26  BUN 18  CREATININE 1.00  CALCIUM 9.7  GLUCOSE 187*   Lab Results  Component Value Date   TROPONINI <0.03 03/01/2016      Radiology:Cardiomegaly with moderate to large pericardial effusion on chest CT EKG: Normal sinus rhythm with no ischemia  ASSESSMENT AND PLAN:  82 year old female with history of breast cancer the past with small pericardial effusion 3 months ago now presenting with chest tightness. Electrocautery gram was unremarkable. Her initial serum troponin was normal. She admits seen in our office yesterday and recommendations were to proceed with functional study. She presented to emergency room with chest  pain. Described as a heaviness. We'll need to reevaluate her pericardium with an echocardiogram to determine the extent of her pericardial effusion. If significant we'll consider either pericardiocentesis or pericardial window at a tertiary care center. Her cardio fusion is not significant, would proceed with functional study. We'll continue with metoprolol. Would defer anticoagulation for now given negative troponin. No evidence of tamponade physiology clinically thus far. Signed: Teodoro Spray MD, [facility] 03/01/2016, 5:02 PM

## 2016-03-02 ENCOUNTER — Encounter: Payer: Self-pay | Admitting: Radiology

## 2016-03-02 ENCOUNTER — Inpatient Hospital Stay: Payer: Medicare Other

## 2016-03-02 LAB — CBC
HCT: 37.3 % (ref 35.0–47.0)
HEMOGLOBIN: 12.9 g/dL (ref 12.0–16.0)
MCH: 31.3 pg (ref 26.0–34.0)
MCHC: 34.6 g/dL (ref 32.0–36.0)
MCV: 90.6 fL (ref 80.0–100.0)
PLATELETS: 192 10*3/uL (ref 150–440)
RBC: 4.12 MIL/uL (ref 3.80–5.20)
RDW: 13.4 % (ref 11.5–14.5)
WBC: 14.3 10*3/uL — AB (ref 3.6–11.0)

## 2016-03-02 LAB — BASIC METABOLIC PANEL
ANION GAP: 7 (ref 5–15)
BUN: 21 mg/dL — ABNORMAL HIGH (ref 6–20)
CALCIUM: 9.5 mg/dL (ref 8.9–10.3)
CHLORIDE: 107 mmol/L (ref 101–111)
CO2: 27 mmol/L (ref 22–32)
CREATININE: 0.75 mg/dL (ref 0.44–1.00)
GFR calc non Af Amer: >60 mL/min (ref >60)
Glucose, Bld: 139 mg/dL — ABNORMAL HIGH (ref 65–99)
Potassium: 3.5 mmol/L (ref 3.5–5.1)
Sodium: 141 mmol/L (ref 135–145)

## 2016-03-02 LAB — ECHOCARDIOGRAM COMPLETE
Height: 66 in
Weight: 2739.2 oz

## 2016-03-02 LAB — URINALYSIS COMPLETE WITH MICROSCOPIC (ARMC ONLY)
Bacteria, UA: NONE SEEN
Bilirubin Urine: NEGATIVE
Glucose, UA: NEGATIVE mg/dL
Hgb urine dipstick: NEGATIVE
Leukocytes, UA: NEGATIVE
Nitrite: NEGATIVE
PH: 5 (ref 5.0–8.0)
PROTEIN: 100 mg/dL — AB
Specific Gravity, Urine: 1.026 (ref 1.005–1.030)

## 2016-03-02 LAB — TROPONIN I

## 2016-03-02 MED ORDER — FUROSEMIDE 40 MG PO TABS
40.0000 mg | ORAL_TABLET | Freq: Once | ORAL | Status: AC
  Administered 2016-03-02: 40 mg via ORAL
  Filled 2016-03-02: qty 1

## 2016-03-02 MED ORDER — TECHNETIUM TC 99M TETROFOSMIN IV KIT
13.3400 | PACK | Freq: Once | INTRAVENOUS | Status: AC | PRN
  Administered 2016-03-02: 13.34 via INTRAVENOUS

## 2016-03-02 MED ORDER — REGADENOSON 0.4 MG/5ML IV SOLN
0.4000 mg | Freq: Once | INTRAVENOUS | Status: AC
  Administered 2016-03-02: 0.4 mg via INTRAVENOUS

## 2016-03-02 MED ORDER — TECHNETIUM TC 99M TETROFOSMIN IV KIT
29.9000 | PACK | Freq: Once | INTRAVENOUS | Status: AC | PRN
  Administered 2016-03-02: 29.9 via INTRAVENOUS

## 2016-03-02 NOTE — Progress Notes (Signed)
[facility] [place] NAME: Crystal Blanchard    MR#:  [id number]  DATE OF BIRTH:  1931-09-13  SUBJECTIVE:  CHIEF COMPLAINT:   Chief Complaint  Patient presents with  . Chest Pain   Still has occasional chest pressure with movement. Not exertional REVIEW OF SYSTEMS:    Review of Systems  Constitutional: Negative for chills and fever.  HENT: Negative for sore throat.   Eyes: Negative for blurred vision, double vision and pain.  Respiratory: Negative for cough, hemoptysis, shortness of breath and wheezing.   Cardiovascular: Positive for chest pain. Negative for palpitations, orthopnea and leg swelling.  Gastrointestinal: Negative for abdominal pain, constipation, diarrhea, heartburn, nausea and vomiting.  Genitourinary: Negative for dysuria and hematuria.  Musculoskeletal: Negative for back pain and joint pain.  Skin: Negative for rash.  Neurological: Negative for sensory change, speech change, focal weakness and headaches.  Endo/Heme/Allergies: Does not bruise/bleed easily.  Psychiatric/Behavioral: Negative for depression. The patient is not nervous/anxious.     DRUG ALLERGIES:  No Known Allergies  VITALS:  Blood pressure (!) 141/58, pulse 86, temperature 100 F (37.8 C), temperature source Oral, resp. rate 18, height 5\' 6"  (1.676 m), weight 77.7 kg (171 lb 3.2 oz), SpO2 90 %.  PHYSICAL EXAMINATION:   Physical Exam  GENERAL:  82 y.o.-year-old patient lying in the bed with no acute distress.  EYES: Pupils equal, round, reactive to light and accommodation. No scleral icterus. Extraocular muscles intact.  HEENT: Head atraumatic, normocephalic. Oropharynx and nasopharynx clear.  NECK:  Supple, no jugular venous distention. No thyroid enlargement, no tenderness.  LUNGS: Normal breath sounds bilaterally, no wheezing, rales, rhonchi. No use of accessory muscles of respiration.  CARDIOVASCULAR: S1, S2 normal. No murmurs, rubs, or  gallops.  ABDOMEN: Soft, nontender, nondistended. Bowel sounds present. No organomegaly or mass.  EXTREMITIES: No cyanosis, clubbing or edema b/l.    NEUROLOGIC: Cranial nerves II through XII are intact. No focal Motor or sensory deficits b/l.   PSYCHIATRIC: The patient is alert and oriented x 3.  SKIN: No obvious rash, lesion, or ulcer.   LABORATORY PANEL:   CBC  Recent Labs Lab 03/02/16 0417  WBC 14.3*  HGB 12.9  HCT 37.3  PLT 192   ------------------------------------------------------------------------------------------------------------------ Chemistries   Recent Labs Lab 03/02/16 0417  NA 141  K 3.5  CL 107  CO2 27  GLUCOSE 139*  BUN 21*  CREATININE 0.75  CALCIUM 9.5   ------------------------------------------------------------------------------------------------------------------  Cardiac Enzymes  Recent Labs Lab 03/02/16 0907  TROPONINI <0.03   ------------------------------------------------------------------------------------------------------------------  RADIOLOGY:  Dg Chest 2 View  Result Date: 03/01/2016 CLINICAL DATA:  Centralized chest pain and weakness beginning at 6 a.m. this morning. History of breast carcinoma. EXAM: CHEST  2 VIEW COMPARISON:  PA and lateral chest 10/18/2015. FINDINGS: The cardiopericardial silhouette is larger than on the comparison study worrisome for pericardial effusion superimposed on cardiomegaly. The lungs are clear. No pulmonary edema. Aortic atherosclerosis is noted. No pleural effusion is seen. No focal bony abnormality. IMPRESSION: Enlargement of the cardiopericardial silhouette since the comparison study worrisome for pericardial effusion superimposed on cardiomegaly. Clear lungs. Atherosclerosis. Electronically Signed   By: Inge Rise M.D.   On: 03/01/2016 12:57   Ct Angio Chest Pe W Or Wo Contrast  Result Date: 03/01/2016 CLINICAL DATA:  Chest pain today, bilateral leg swelling, history of breast carcinoma  EXAM: CT ANGIOGRAPHY CHEST WITH CONTRAST TECHNIQUE: Multidetector CT imaging of the chest was performed using the standard  protocol during bolus administration of intravenous contrast. Multiplanar CT image reconstructions and MIPs were obtained to evaluate the vascular anatomy. CONTRAST:  75 cc Isovue 370 COMPARISON:  Chest x-ray of 03/01/2016 FINDINGS: Cardiovascular: The pulmonary arteries are well opacified. There is no evidence of acute pulmonary embolism. The thoracic aorta also is well opacified with no acute abnormality. Moderate thoracic aortic atherosclerosis is noted. Those also cardiomegaly present. A moderate size to large pericardial effusion is present. Mediastinum/Nodes: Only small mediastinal lymph nodes are present. No mediastinal or hilar adenopathy is seen. Lungs/Pleura: On lung window images, there is linear atelectasis or scarring in the left lower lobe. Some this may be due to compressive atelectasis from the adjacent cardiomegaly and pericardial effusion. No pleural effusion is seen. Upper Abdomen: The upper abdomen is unremarkable. Musculoskeletal: There are degenerative changes throughout the thoracic spine. No compression deformity is seen. Review of the MIP images confirms the above findings. IMPRESSION: 1. Moderate to large pericardial effusion.  Cardiomegaly. 2. No evidence of acute pulmonary embolism. 3. Moderate thoracic aortic atherosclerosis. 4. Probable compressive atelectasis at the left lower lobe as result of cardiomegaly and pericardial effusion. Electronically Signed   By: Ivar Drape M.D.   On: 03/01/2016 14:14   US Venous Img Lower Bilateral  Result Date: 03/01/2016 CLINICAL DATA:  Bilateral lower extremity edema. EXAM: BILATERAL LOWER EXTREMITY VENOUS DOPPLER ULTRASOUND TECHNIQUE: Gray-scale sonography with graded compression, as well as color Doppler and duplex ultrasound were performed to evaluate the lower extremity deep venous systems from the level of the common  femoral vein and including the common femoral, femoral, profunda femoral, popliteal and calf veins including the posterior tibial, peroneal and gastrocnemius veins when visible. The superficial great saphenous vein was also interrogated. Spectral Doppler was utilized to evaluate flow at rest and with distal augmentation maneuvers in the common femoral, femoral and popliteal veins. COMPARISON:  None. FINDINGS: RIGHT LOWER EXTREMITY Normal compressibility, augmentation and color Doppler flow in the right common femoral vein, right femoral vein and right popliteal vein. The right saphenofemoral junction is patent. Right profunda femoral vein is patent without thrombus. Visualized right deep calf veins are patent without thrombus. LEFT LOWER EXTREMITY Normal compressibility, augmentation and color Doppler flow in the left common femoral vein, left femoral vein and left popliteal vein. The left saphenofemoral junction is patent. Left profunda femoral vein is patent without thrombus. Visualized left deep calf veins are patent without thrombus. Other Findings:  None. IMPRESSION: No evidence of deep venous thrombosis in the lower extremities. Electronically Signed   By: Markus Daft M.D.   On: 03/01/2016 16:21     ASSESSMENT AND PLAN:   * Pericardial effusion, Mod-large without tamponade Etiology unclear? Needs f/u  * Chest pressure Likely from pericardial effusion Stress test done. Results pending. Tele. Discussed with Dr. Ubaldo Glassing  * HTN Continue home meds  All the records are reviewed and case discussed with Care Management/Social Workerr. Management plans discussed with the patient, family and they are in agreement.  DVT Prophylaxis: SCDs  TOTAL TIME TAKING CARE OF THIS PATIENT: 30 minutes.   POSSIBLE D/C IN 1-2 DAYS, DEPENDING ON CLINICAL CONDITION.  Hillary Bow R M.D on 03/02/2016 at 8:38 PM  Between 7am to 6pm - Pager - [phone]  After 6pm go to [email or web address] - password [email or web address]  [facility]  Office  [phone]  CC: Primary care physician; Albina Billet, MD  Note: This dictation was prepared with Dragon dictation along with smaller phrase technology. Any transcriptional  errors that result from this process are unintentional.

## 2016-03-02 NOTE — Progress Notes (Signed)
Zofran 4mg iv given for complaints of nausea, will monitor. 

## 2016-03-02 NOTE — Care Management (Signed)
Patient presents with sx of heaviness in her chest.  A pericardial effusion was identified approximately 3 months ago.  Will have echo to determine status of this finding and progression of plan will be determined by those results.  Independent in all adls, denies issues accessing medical care, obtaining medications or with transportation.  Current with her PCP.  Lives at the [facility]. Her daughter is supportive.  No assistive ambulatory device.  Provided brochure for [facility].   No discharge needs identified at present by care manager or members of care team

## 2016-03-02 NOTE — Discharge Instructions (Signed)
Cardiac diet    Activity as tolerated

## 2016-03-02 NOTE — Progress Notes (Signed)
To nuclear medicine via bed 

## 2016-03-02 NOTE — Progress Notes (Signed)
Back from nuclear medicine 

## 2016-03-03 ENCOUNTER — Ambulatory Visit (HOSPITAL_COMMUNITY)
Admission: AD | Admit: 2016-03-03 | Discharge: 2016-03-03 | Disposition: A | Discharge status: Home / Self Care | Payer: Medicare Other | Source: Other Acute Inpatient Hospital | Attending: Internal Medicine | Admitting: Internal Medicine

## 2016-03-03 DIAGNOSIS — I519 Heart disease, unspecified: Secondary | ICD-10-CM | POA: Insufficient documentation

## 2016-03-03 DIAGNOSIS — R6889 Other general symptoms and signs: Secondary | ICD-10-CM | POA: Insufficient documentation

## 2016-03-03 LAB — NM MYOCAR MULTI W/SPECT W/WALL MOTION / EF
CHL CUP MPHR: 139 {beats}/min
CHL CUP NUCLEAR SDS: 1
CHL CUP NUCLEAR SRS: 6
CHL CUP NUCLEAR SSS: 5
CHL CUP STRESS STAGE 1 GRADE: 0 %
CHL CUP STRESS STAGE 1 HR: 88 {beats}/min
CHL CUP STRESS STAGE 1 SPEED: 0 mph
CHL CUP STRESS STAGE 2 HR: 88 {beats}/min
CHL CUP STRESS STAGE 4 GRADE: 0 %
CHL CUP STRESS STAGE 4 HR: 98 {beats}/min
CHL CUP STRESS STAGE 4 SBP: 127 mmHg
CHL CUP STRESS STAGE 4 SPEED: 0 mph
CSEPEDS: 1 s
CSEPEW: 1 METS
CSEPHR: 73 %
Exercise duration (min): 1 min
LV dias vol: 62 mL (ref 46–106)
LV sys vol: 16 mL
Peak HR: 98 {beats}/min
Percent of predicted max HR: 70 %
Rest HR: 88 {beats}/min
Stage 2 Grade: 0 %
Stage 2 Speed: 0 mph
Stage 3 Grade: 0 %
Stage 3 HR: 98 {beats}/min
Stage 3 Speed: 0 mph
Stage 4 DBP: 49 mmHg
TID: 0.85

## 2016-03-03 LAB — CBC WITH DIFFERENTIAL/PLATELET
Basophils Absolute: 0.1 10*3/uL (ref 0–0.1)
Basophils Relative: 1 %
EOS PCT: 1 %
Eosinophils Absolute: 0.1 10*3/uL (ref 0–0.7)
HEMATOCRIT: 37 % (ref 35.0–47.0)
Hemoglobin: 12.6 g/dL (ref 12.0–16.0)
LYMPHS ABS: 2.7 10*3/uL (ref 1.0–3.6)
LYMPHS PCT: 19 %
MCH: 31 pg (ref 26.0–34.0)
MCHC: 34.1 g/dL (ref 32.0–36.0)
MCV: 91 fL (ref 80.0–100.0)
MONO ABS: 1.5 10*3/uL — AB (ref 0.2–0.9)
Monocytes Relative: 11 %
NEUTROS ABS: 9.3 10*3/uL — AB (ref 1.4–6.5)
Neutrophils Relative %: 68 %
PLATELETS: 182 10*3/uL (ref 150–440)
RBC: 4.07 MIL/uL (ref 3.80–5.20)
RDW: 13.7 % (ref 11.5–14.5)
WBC: 13.7 10*3/uL — ABNORMAL HIGH (ref 3.6–11.0)

## 2016-03-03 NOTE — Progress Notes (Signed)
[place]  SUBJECTIVE: weak and fatigued   Vitals:   03/02/16 1152 03/02/16 1947 03/02/16 2157 03/02/16 2303  BP: (!) 167/65 (!) 141/58    Pulse: 87 86 87   Resp: 16 18    Temp: (!) 100.8 F (38.2 C) 100 F (37.8 C) (!) 102.2 F (39 C) 99.5 F (37.5 C)  TempSrc: Oral Oral Oral Oral  SpO2: 91% 90% 90%   Weight:      Height:        Intake/Output Summary (Last 24 hours) at 03/03/16 0750 Last data filed at 03/03/16 0500  Gross per 24 hour  Intake              483 ml  Output              500 ml  Net              -17 ml    LABS: Basic Metabolic Panel:  Recent Labs  03/01/16 1226 03/01/16 1641 03/02/16 0417  NA 140  --  141  K 3.5  --  3.5  CL 104  --  107  CO2 26  --  27  GLUCOSE 187*  --  139*  BUN 18  --  21*  CREATININE 1.00 0.95 0.75  CALCIUM 9.7  --  9.5   Liver Function Tests: No results for input(s): AST, ALT, ALKPHOS, BILITOT, PROT, ALBUMIN in the last 72 hours. No results for input(s): LIPASE, AMYLASE in the last 72 hours. CBC:  Recent Labs  03/01/16 1641 03/02/16 0417  WBC 13.2* 14.3*  HGB 13.1 12.9  HCT 38.1 37.3  MCV 90.4 90.6  PLT 206 192   Cardiac Enzymes:  Recent Labs  03/01/16 1226 03/02/16 0907  TROPONINI <0.03 <0.03   BNP: Invalid input(s): POCBNP D-Dimer: No results for input(s): DDIMER in the last 72 hours. Hemoglobin A1C: No results for input(s): HGBA1C in the last 72 hours. Fasting Lipid Panel: No results for input(s): CHOL, HDL, LDLCALC, TRIG, CHOLHDL, LDLDIRECT in the last 72 hours. Thyroid Function Tests:  Recent Labs  03/01/16 1641  TSH 1.126   Anemia Panel: No results for input(s): VITAMINB12, FOLATE, FERRITIN, TIBC, IRON, RETICCTPCT in the last 72 hours.   Physical Exam: Blood pressure (!) 141/58, pulse 87, temperature 99.5 F (37.5 C), temperature source Oral, resp. rate 18, height 5\' 6"  (1.676 m), weight 77.7 kg (171 lb 3.2 oz), SpO2 90 %.   Wt Readings from Last  1 Encounters:  03/01/16 77.7 kg (171 lb 3.2 oz)     General appearance: alert and cooperative Resp: clear to auscultation bilaterally Cardio: regular rate and rhythm Extremities: extremities normal, atraumatic, no cyanosis or edema Neurologic: Grossly normal  TELEMETRY: Reviewed telemetry pt in nsr:  ASSESSMENT AND PLAN:  Active Problems:   Pericardial effusion-small pericardial effusion with no tamponade physiology. Does not appear to be hemodynamically significant. Functional study negative for ischemia. Remains with low grade fever with negative blood cultures thus far, negative urine cultures and no evidence of pneumonia. Will need consideration for pericardial window for both diagnostic and therapeutic reasons. Discussed with Dr. Carlye Grippe at [facility]. WIll arrange transfer to [facility].     Teodoro Spray., MD, [facility] 03/03/2016 7:50 AM

## 2016-03-03 NOTE — Discharge Summary (Signed)
[place] at [place] NAME: Crystal Blanchard    MR#:  [id number]  DATE OF BIRTH:  [birth year]  DATE OF ADMISSION:  03/01/2016 ADMITTING PHYSICIAN: Bettey Costa, MD  DATE OF DISCHARGE: 03/03/2016  PRIMARY CARE PHYSICIAN: Albina Billet, MD   ADMISSION DIAGNOSIS:  Pericardial effusion [I31.9] Chest pain [R07.9] Moderate risk chest pain [R07.9]  DISCHARGE DIAGNOSIS:  Active Problems:   Pericardial effusion   SECONDARY DIAGNOSIS:   Past Medical History:  Diagnosis Date  . Arthritis   . Breast cancer [facility]) 2011   Wide excision, radiation therapy, tamoxifen. Intermediate grade DCIS.  Marland Kitchen Elevated blood sugar   . Hyperlipidemia   . Hypertension 1985  . Hypothyroidism   . Macular degeneration 2013   white macular degeneration-RECEIVES EYE INJECTIONS EVERY 5 WEEKS  . 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 ER 90%, PR 90%..  . Skin cancer   . Thyroid disease 1996  . Vertigo      ADMITTING HISTORY  Crystal Blanchard  is a 82 y.o. female with a known history of Hypertension and hypothyroidism who presents with above complaint. Yeah patient was seen yesterday by Dr. Ubaldo Glassing due to shortness of breath and chest pain. She was recommended to get stress test which is scheduled for tomorrow. However due to recurrent chest pain she presents to the ER. In the emergency room CT of the chest was performed which showed a moderate to large pericardial effusion.  Cardiology was called and recommended admission to the hospital and according.  HOSPITAL COURSE:   * Pericardial effusion, Mod-large without tamponade - Pericarditis? Etiology unclear? Needs pericardial window. Accepted at [facility] and transfer for procedure with Dr. Carlye Grippe. Differential includes malignancy due to breast cancer history, although  unlikely. Appreciate Dr. Bethanne Ginger help with co-ordinating transfer  * Fever Etiology unclear. CXR - clear UA - No UTI Order Blood cx - Stable No abx  * Chest pressure Likely from pericardial effusion Stress test done and negative. Discussed with Dr. Ubaldo Glassing  * HTN Continue home meds  Stable for transfer to [facility]  CONSULTS OBTAINED:  Treatment Team:  Teodoro Spray, MD  DRUG ALLERGIES:  No Known Allergies  DISCHARGE MEDICATIONS:   Current Discharge Medication List    CONTINUE these medications which have NOT CHANGED   Details  aspirin 81 MG tablet Take 81 mg by mouth daily.    B Complex-C-Folic Acid (STRESS FORMULA) TABS Take 1 tablet by mouth daily.    benazepril (LOTENSIN) 40 MG tablet Take 40 mg by mouth every morning.     Cholecalciferol (VITAMIN D3) 2000 UNITS TABS Take 1 tablet by mouth daily.    doxazosin (CARDURA) 8 MG tablet Take 8 mg by mouth at bedtime.     levothyroxine (SYNTHROID, LEVOTHROID) 75 MCG tablet Take 75 mcg by mouth daily before breakfast.    Multiple Vitamins-Minerals (PRESERVISION AREDS 2 PO) Take 1 tablet by mouth 2 (two) times daily.    hydrochlorothiazide (HYDRODIURIL) 25 MG tablet Take 25 mg by mouth daily.     HYDROcodone-acetaminophen (NORCO) 5-325 MG tablet Take 1-2 tablets by mouth every 4 (four) hours as needed for moderate pain. Qty: 30 tablet, Refills: 0        Today   VITAL SIGNS:  Blood pressure (!) 136/53, pulse 74, temperature 99.5 F (37.5 C), temperature source Oral, resp.  rate 16, height 5\' 6"  (1.676 m), weight 77.7 kg (171 lb 3.2 oz), SpO2 92 %.  I/O:   Intake/Output Summary (Last 24 hours) at 03/03/16 1713 Last data filed at 03/03/16 1609  Gross per 24 hour  Intake              960 ml  Output              550 ml  Net              410 ml    PHYSICAL EXAMINATION:  Physical Exam  GENERAL:  82 y.o.-year-old patient lying in the bed with no acute distress.  LUNGS: Normal breath sounds bilaterally, no  wheezing, rales,rhonchi or crepitation. No use of accessory muscles of respiration.  CARDIOVASCULAR: S1, S2 normal. No murmurs, rubs, or gallops.  ABDOMEN: Soft, non-tender, non-distended. Bowel sounds present. No organomegaly or mass.  NEUROLOGIC: Moves all 4 extremities. PSYCHIATRIC: The patient is alert and oriented x 3.  SKIN: No obvious rash, lesion, or ulcer.   DATA REVIEW:   CBC  Recent Labs Lab 03/03/16 0802  WBC 13.7*  HGB 12.6  HCT 37.0  PLT 182    Chemistries   Recent Labs Lab 03/02/16 0417  NA 141  K 3.5  CL 107  CO2 27  GLUCOSE 139*  BUN 21*  CREATININE 0.75  CALCIUM 9.5    Cardiac Enzymes  Recent Labs Lab 03/02/16 0907  TROPONINI <0.03    Microbiology Results  No results found for this or any previous visit.  RADIOLOGY:  Nm Myocar Multi W/spect W/wall Motion / Ef  Result Date: 03/03/2016  Blood pressure demonstrated a normal response to exercise.  There was no ST segment deviation noted during stress.  The study is normal.  This is a low risk study.  The left ventricular ejection fraction is normal (55-65%).     Follow up with PCP in 1 week.  Management plans discussed with the patient, family and they are in agreement.  CODE STATUS:     Code Status Orders        Start     Ordered   03/01/16 1616  Full code  Continuous     03/01/16 1615    Code Status History    Date Active Date Inactive Code Status Order ID Comments User Context   03/01/2016  4:15 PM 03/01/2016  7:12 PM Full Code [id number]  Bettey Costa, MD Inpatient    Advance Directive Documentation   Flowsheet Row Most Recent Value  Type of Advance Directive  Healthcare Power of Attorney  Pre-existing out of facility DNR order (yellow form or pink MOST form)  No data  "MOST" Form in Place?  No data      TOTAL TIME TAKING CARE OF THIS PATIENT ON DAY OF DISCHARGE: more than 30 minutes.   Hillary Bow R M.D on 03/03/2016 at 5:13 PM  Between 7am to 6pm - Pager -  [phone]  After 6pm go to [email or web address] - password [email or web address] [facility]  Office  [phone]  CC: Primary care physician; Albina Billet, MD  Note: This dictation was prepared with Dragon dictation along with smaller phrase technology. Any transcriptional errors that result from this process are unintentional.

## 2016-03-03 NOTE — Care Management (Signed)
Patient for transfer to [facility] for consideration for pericardial window.  Dr Ubaldo Glassing is arranging the transfer

## 2016-03-03 NOTE — Progress Notes (Signed)
[facility] NAME: Crystal Blanchard    MR#:  [id number]  DATE OF BIRTH:  [birth year]  SUBJECTIVE:  CHIEF COMPLAINT:   Chief Complaint  Patient presents with  . Chest Pain   Still has occasional chest pressure with movement. Not exertional REVIEW OF SYSTEMS:    Review of Systems  Constitutional: Negative for chills and fever.  HENT: Negative for sore throat.   Eyes: Negative for blurred vision, double vision and pain.  Respiratory: Negative for cough, hemoptysis, shortness of breath and wheezing.   Cardiovascular: Positive for chest pain. Negative for palpitations, orthopnea and leg swelling.  Gastrointestinal: Negative for abdominal pain, constipation, diarrhea, heartburn, nausea and vomiting.  Genitourinary: Negative for dysuria and hematuria.  Musculoskeletal: Negative for back pain and joint pain.  Skin: Negative for rash.  Neurological: Negative for sensory change, speech change, focal weakness and headaches.  Endo/Heme/Allergies: Does not bruise/bleed easily.  Psychiatric/Behavioral: Negative for depression. The patient is not nervous/anxious.     DRUG ALLERGIES:  No Known Allergies  VITALS:  Blood pressure (!) 136/53, pulse 74, temperature 99.5 F (37.5 C), temperature source Oral, resp. rate 16, height 5\' 6"  (1.676 m), weight 77.7 kg (171 lb 3.2 oz), SpO2 92 %.  PHYSICAL EXAMINATION:   Physical Exam  GENERAL:  82 y.o.-year-old patient lying in the bed with no acute distress.  EYES: Pupils equal, round, reactive to light and accommodation. No scleral icterus. Extraocular muscles intact.  HEENT: Head atraumatic, normocephalic. Oropharynx and nasopharynx clear.  NECK:  Supple, no jugular venous distention. No thyroid enlargement, no tenderness.  LUNGS: Normal breath sounds bilaterally, no wheezing, rales, rhonchi. No use of accessory muscles of respiration.  CARDIOVASCULAR: S1, S2 normal. No murmurs, rubs, or  gallops.  ABDOMEN: Soft, nontender, nondistended. Bowel sounds present. No organomegaly or mass.  EXTREMITIES: No cyanosis, clubbing or edema b/l.    NEUROLOGIC: Cranial nerves II through XII are intact. No focal Motor or sensory deficits b/l.   PSYCHIATRIC: The patient is alert and oriented x 3.  SKIN: No obvious rash, lesion, or ulcer.   LABORATORY PANEL:   CBC  Recent Labs Lab 03/03/16 0802  WBC 13.7*  HGB 12.6  HCT 37.0  PLT 182   ------------------------------------------------------------------------------------------------------------------ Chemistries   Recent Labs Lab 03/02/16 0417  NA 141  K 3.5  CL 107  CO2 27  GLUCOSE 139*  BUN 21*  CREATININE 0.75  CALCIUM 9.5   ------------------------------------------------------------------------------------------------------------------  Cardiac Enzymes  Recent Labs Lab 03/02/16 0907  TROPONINI <0.03   ------------------------------------------------------------------------------------------------------------------  RADIOLOGY:  Dg Chest 2 View  Result Date: 03/01/2016 CLINICAL DATA:  Centralized chest pain and weakness beginning at 6 a.m. this morning. History of breast carcinoma. EXAM: CHEST  2 VIEW COMPARISON:  PA and lateral chest 10/18/2015. FINDINGS: The cardiopericardial silhouette is larger than on the comparison study worrisome for pericardial effusion superimposed on cardiomegaly. The lungs are clear. No pulmonary edema. Aortic atherosclerosis is noted. No pleural effusion is seen. No focal bony abnormality. IMPRESSION: Enlargement of the cardiopericardial silhouette since the comparison study worrisome for pericardial effusion superimposed on cardiomegaly. Clear lungs. Atherosclerosis. Electronically Signed   By: Inge Rise M.D.   On: 03/01/2016 12:57   Ct Angio Chest Pe W Or Wo Contrast  Result Date: 03/01/2016 CLINICAL DATA:  Chest pain today, bilateral leg swelling, history of breast carcinoma  EXAM: CT ANGIOGRAPHY CHEST WITH CONTRAST TECHNIQUE: Multidetector CT imaging of the chest was performed using the standard  protocol during bolus administration of intravenous contrast. Multiplanar CT image reconstructions and MIPs were obtained to evaluate the vascular anatomy. CONTRAST:  75 cc Isovue 370 COMPARISON:  Chest x-ray of 03/01/2016 FINDINGS: Cardiovascular: The pulmonary arteries are well opacified. There is no evidence of acute pulmonary embolism. The thoracic aorta also is well opacified with no acute abnormality. Moderate thoracic aortic atherosclerosis is noted. Those also cardiomegaly present. A moderate size to large pericardial effusion is present. Mediastinum/Nodes: Only small mediastinal lymph nodes are present. No mediastinal or hilar adenopathy is seen. Lungs/Pleura: On lung window images, there is linear atelectasis or scarring in the left lower lobe. Some this may be due to compressive atelectasis from the adjacent cardiomegaly and pericardial effusion. No pleural effusion is seen. Upper Abdomen: The upper abdomen is unremarkable. Musculoskeletal: There are degenerative changes throughout the thoracic spine. No compression deformity is seen. Review of the MIP images confirms the above findings. IMPRESSION: 1. Moderate to large pericardial effusion.  Cardiomegaly. 2. No evidence of acute pulmonary embolism. 3. Moderate thoracic aortic atherosclerosis. 4. Probable compressive atelectasis at the left lower lobe as result of cardiomegaly and pericardial effusion. Electronically Signed   By: Ivar Drape M.D.   On: 03/01/2016 14:14   US Venous Img Lower Bilateral  Result Date: 03/01/2016 CLINICAL DATA:  Bilateral lower extremity edema. EXAM: BILATERAL LOWER EXTREMITY VENOUS DOPPLER ULTRASOUND TECHNIQUE: Gray-scale sonography with graded compression, as well as color Doppler and duplex ultrasound were performed to evaluate the lower extremity deep venous systems from the level of the common  femoral vein and including the common femoral, femoral, profunda femoral, popliteal and calf veins including the posterior tibial, peroneal and gastrocnemius veins when visible. The superficial great saphenous vein was also interrogated. Spectral Doppler was utilized to evaluate flow at rest and with distal augmentation maneuvers in the common femoral, femoral and popliteal veins. COMPARISON:  None. FINDINGS: RIGHT LOWER EXTREMITY Normal compressibility, augmentation and color Doppler flow in the right common femoral vein, right femoral vein and right popliteal vein. The right saphenofemoral junction is patent. Right profunda femoral vein is patent without thrombus. Visualized right deep calf veins are patent without thrombus. LEFT LOWER EXTREMITY Normal compressibility, augmentation and color Doppler flow in the left common femoral vein, left femoral vein and left popliteal vein. The left saphenofemoral junction is patent. Left profunda femoral vein is patent without thrombus. Visualized left deep calf veins are patent without thrombus. Other Findings:  None. IMPRESSION: No evidence of deep venous thrombosis in the lower extremities. Electronically Signed   By: Markus Daft M.D.   On: 03/01/2016 16:21     ASSESSMENT AND PLAN:   * Pericardial effusion, Mod-large without tamponade - Pericarditis? Etiology unclear? Needs pericardial window. Accepted at [facility] and transfer out Sunday evening or Monday morning for procedure on Monday with Dr. Carlye Grippe. Differential includes malignancy due to breast cancer history, although unlikely. Appreciate Dr. Bethanne Ginger help with co-ordinating transfer  * Fever Etiology unclear. CXR - clear UA - No UTI Order Blood cx. No abx  * Chest pressure Likely from pericardial effusion Stress test done and negative. Discussed with Dr. Ubaldo Glassing  * HTN Continue home meds  All the records are reviewed and case discussed with Care Management/Social Workerr. Management plans  discussed with the patient, family and they are in agreement.  DVT Prophylaxis: SCDs  TOTAL TIME TAKING CARE OF THIS PATIENT: 30 minutes.   Hillary Bow R M.D on 03/03/2016 at 12:35 PM  Between 7am to 6pm -  Pager - [phone]  After 6pm go to [email or web address] - password [email or web address] [facility]  Office  [phone]  CC: Primary care physician; Albina Billet, MD  Note: This dictation was prepared with Dragon dictation along with smaller phrase technology. Any transcriptional errors that result from this process are unintentional.

## 2016-03-03 NOTE — Progress Notes (Signed)
Crystal Blanchard to be transferred to [facility] per MD order for a pericardial window with Dr. Carlye Grippe. Pt verbalized understanding and family is aware.   Vitals:   03/03/16 1730 03/03/16 1936  BP: (!) 175/64 (!) 174/63  Pulse: 79 77  Resp: 18 18  Temp: 99 F (37.2 C) 98.8 F (37.1 C)    Skin clean, dry and intact without evidence of skin break down, no evidence of skin tears noted. IV catheter still  intact. Telemetry removed. Pt denies pain at this time. No complaints noted.   Patient escorted via stretcher with [facility] team, report had been called by Day shift RN, Tammy, to [facility] [place] [facility], pt will be going to room 4. I gave report to [facility] Link team, and gave all paperwork to them, no further questions.  [name] 03/03/2016 8:38 PM

## 2016-03-03 NOTE — Care Management Important Message (Signed)
Important Message  Patient Details  Name: MAKYNZEE CURLES MRN: [id number] Date of Birth: 09/28/1931   Medicare Important Message Given:  Yes    Katrina Stack, RN 03/03/2016, 1:59 PM

## 2016-03-04 LAB — URINE CULTURE

## 2016-03-08 LAB — CULTURE, BLOOD (ROUTINE X 2)
Culture: NO GROWTH
Culture: NO GROWTH

## 2017-01-29 ENCOUNTER — Other Ambulatory Visit: Payer: Self-pay | Admitting: Internal Medicine

## 2017-01-29 DIAGNOSIS — Z853 Personal history of malignant neoplasm of breast: Secondary | ICD-10-CM

## 2017-03-13 ENCOUNTER — Ambulatory Visit
Admission: RE | Admit: 2017-03-13 | Discharge: 2017-03-13 | Disposition: A | Discharge status: Home / Self Care | Payer: Medicare Other | Source: Ambulatory Visit | Attending: Internal Medicine | Admitting: Internal Medicine

## 2017-03-13 DIAGNOSIS — Z9889 Other specified postprocedural states: Secondary | ICD-10-CM | POA: Insufficient documentation

## 2017-03-13 DIAGNOSIS — Z853 Personal history of malignant neoplasm of breast: Secondary | ICD-10-CM

## 2017-03-13 HISTORY — DX: Personal history of irradiation: Z92.3

## 2017-05-03 IMAGING — CR DG CHOLANGIOGRAM OPERATIVE
3 series · 14 of 23 positions shown · non-contrast
Comparison: 02/08/2010

CLINICAL DATA: Cholelithiasis

EXAM:
INTRAOPERATIVE CHOLANGIOGRAM
TECHNIQUE: Cholangiographic images from the C-arm fluoroscopic device were
submitted for interpretation post-operatively. Please see the
procedural report for the amount of contrast and the fluoroscopy
time utilized.

[Series 2: cont. · 2 of 3 frames shown (1 of 3)]
[frame 1/3]
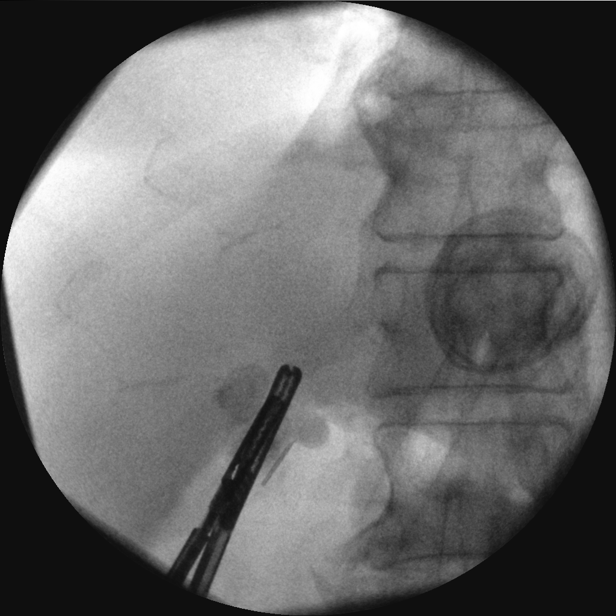
[frame 3/3]
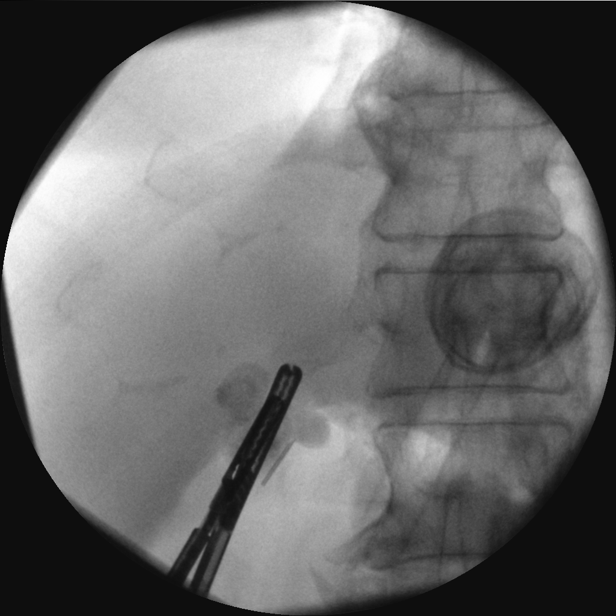

[Series 3: cont. · 6 of 83 frames shown (2 of 3)]
[frame 10/83]
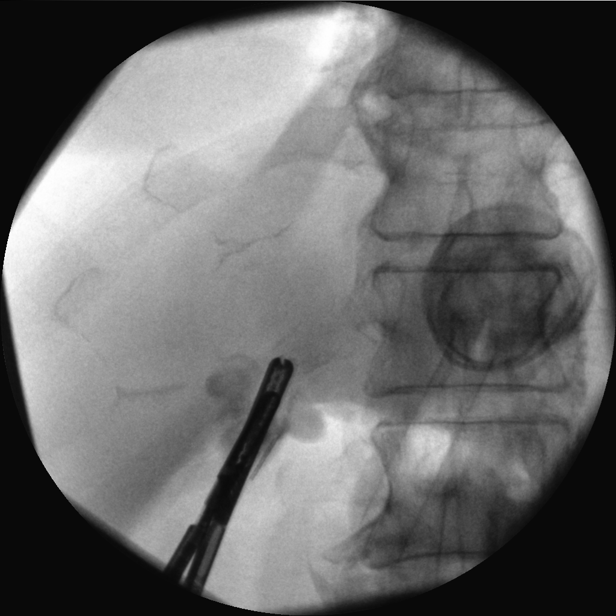
[frame 19/83]
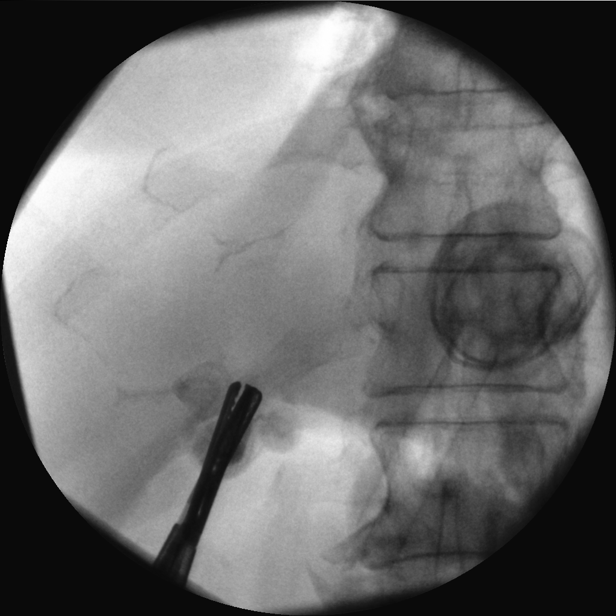
[frame 37/83]
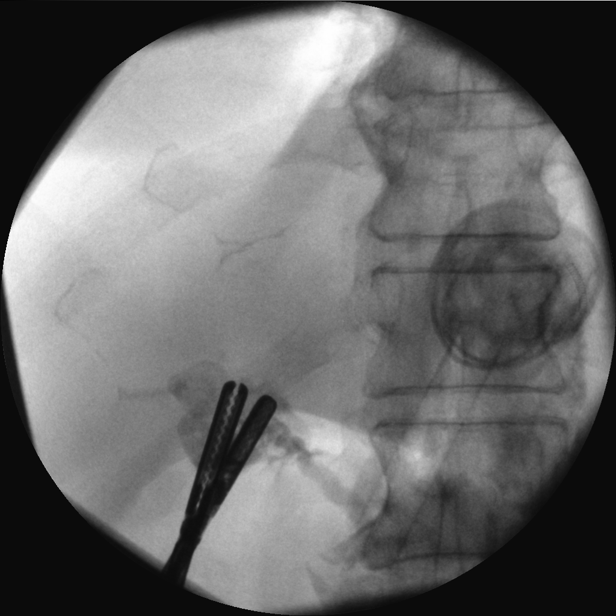
[frame 55/83]
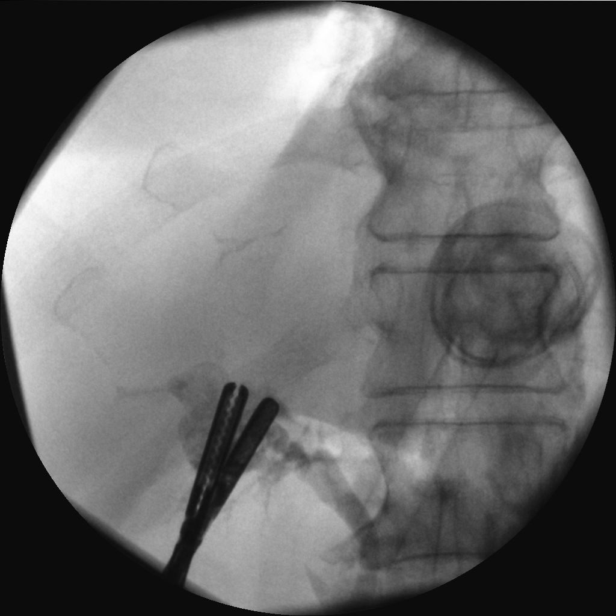
[frame 64/83]
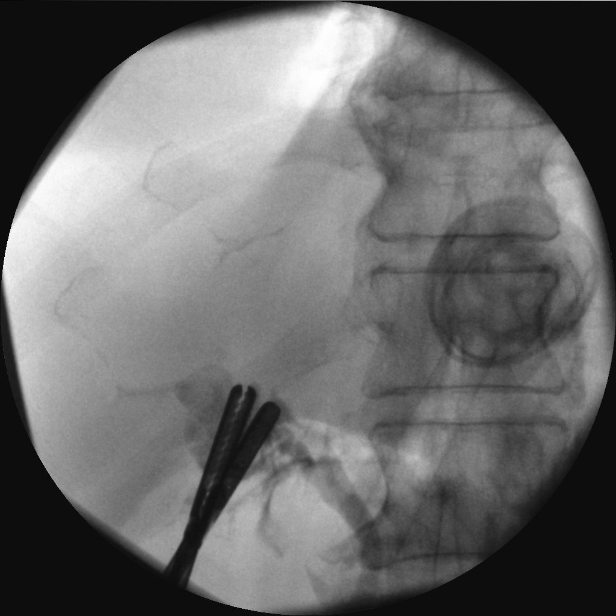
[frame 83/83]
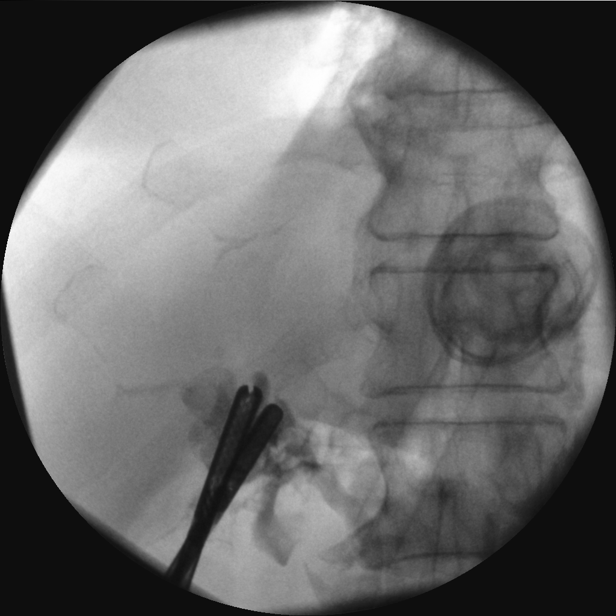

[Series 4: cont. · 6 of 17 frames shown (3 of 3)]
[frame 1/17]
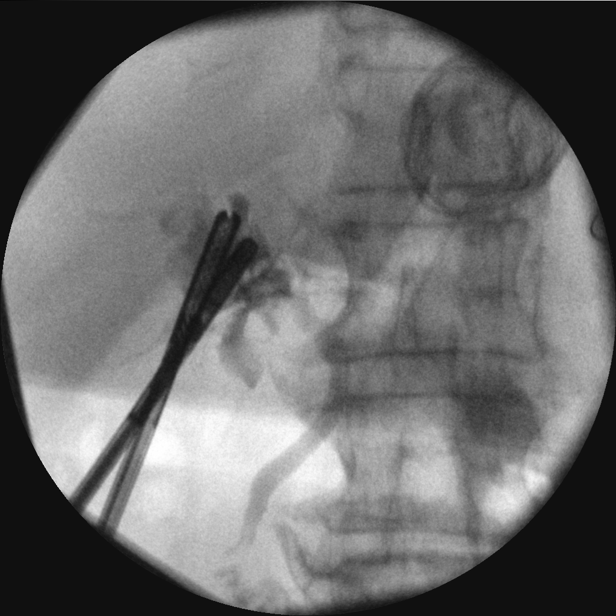
[frame 4/17]
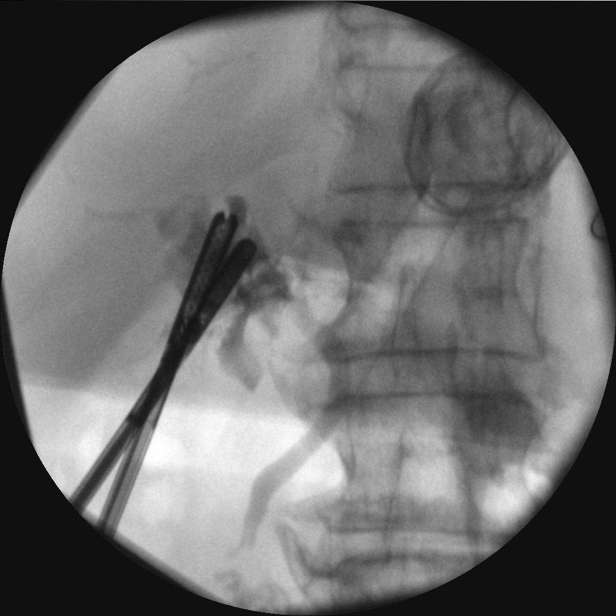
[frame 8/17]
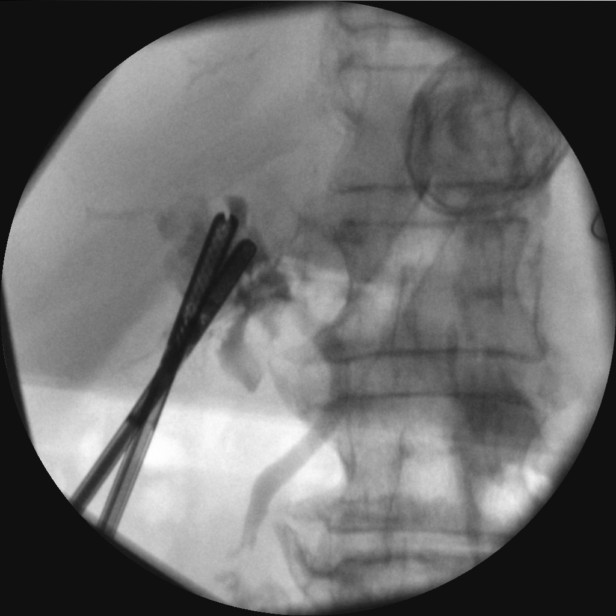
[frame 9/17]
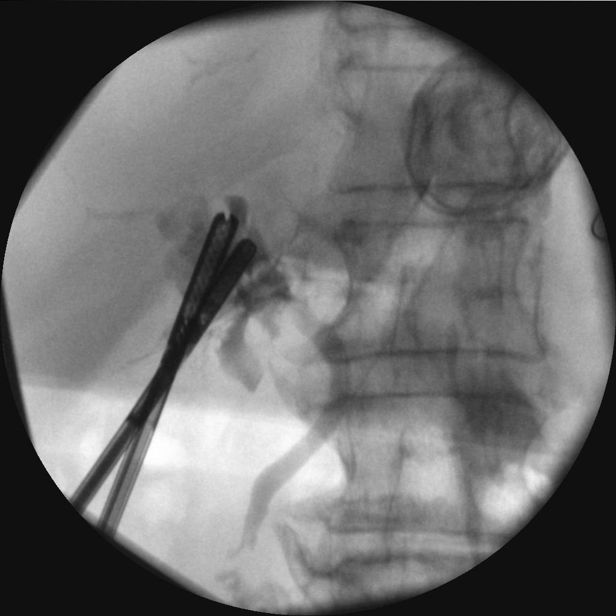
[frame 13/17]
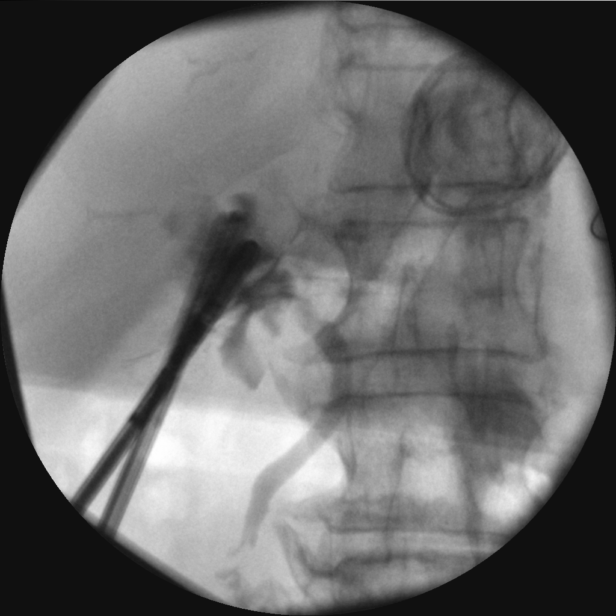
[frame 17/17]
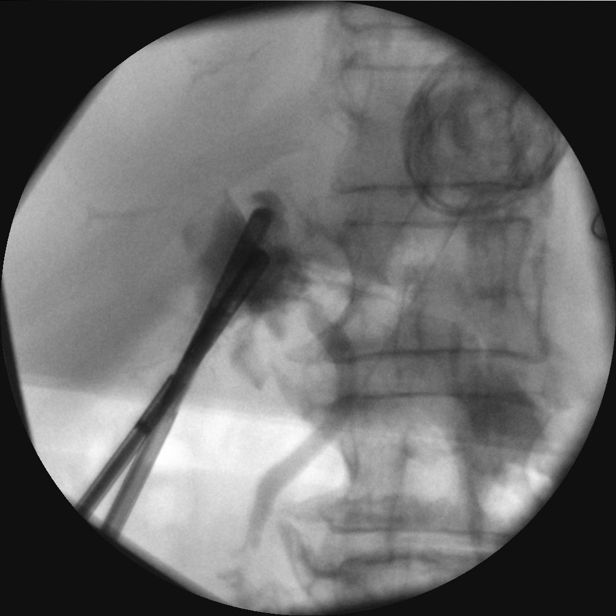

[14 of 23 positions shown; findings below may reference images not displayed]

FINDINGS: Limited intraoperative cholangiogram performed during the
laparoscopic cholecystectomy. Extravasation noted at the
cholecystectomy site. Common bile duct is visualized. There is
drainage into the duodenum. Small nonobstructing filling defect at
the distal CBD persist on several images. Difficult to exclude
distal choledocholithiasis.
IMPRESSION: Limited intraoperative cholangiogram with motion artifact and
extravasation.

Common bile duct is patent with contrast draining into the duodenum.

Distal CBD small filling defect , choledocholithiasis difficult to
exclude. But there is no associated duct obstruction.

## 2018-02-05 ENCOUNTER — Other Ambulatory Visit: Payer: Self-pay | Admitting: Internal Medicine

## 2018-02-05 DIAGNOSIS — Z1231 Encounter for screening mammogram for malignant neoplasm of breast: Secondary | ICD-10-CM

## 2018-03-14 ENCOUNTER — Ambulatory Visit
Admission: RE | Admit: 2018-03-14 | Discharge: 2018-03-14 | Disposition: A | Discharge status: Home / Self Care | Payer: Medicare Other | Source: Ambulatory Visit | Attending: Internal Medicine | Admitting: Internal Medicine

## 2018-03-14 DIAGNOSIS — Z1231 Encounter for screening mammogram for malignant neoplasm of breast: Secondary | ICD-10-CM | POA: Diagnosis not present

## 2018-03-18 ENCOUNTER — Other Ambulatory Visit: Payer: Self-pay | Admitting: Internal Medicine

## 2018-03-18 DIAGNOSIS — N6310 Unspecified lump in the right breast, unspecified quadrant: Secondary | ICD-10-CM

## 2018-03-18 DIAGNOSIS — R928 Other abnormal and inconclusive findings on diagnostic imaging of breast: Secondary | ICD-10-CM

## 2018-03-27 ENCOUNTER — Ambulatory Visit
Admission: RE | Admit: 2018-03-27 | Discharge: 2018-03-27 | Disposition: A | Discharge status: Home / Self Care | Payer: Medicare Other | Source: Ambulatory Visit | Attending: Internal Medicine | Admitting: Internal Medicine

## 2018-03-27 DIAGNOSIS — N6310 Unspecified lump in the right breast, unspecified quadrant: Secondary | ICD-10-CM

## 2018-03-27 DIAGNOSIS — R928 Other abnormal and inconclusive findings on diagnostic imaging of breast: Secondary | ICD-10-CM | POA: Insufficient documentation

## 2018-08-22 ENCOUNTER — Other Ambulatory Visit: Payer: Self-pay | Admitting: Internal Medicine

## 2018-08-22 DIAGNOSIS — R928 Other abnormal and inconclusive findings on diagnostic imaging of breast: Secondary | ICD-10-CM

## 2018-09-27 ENCOUNTER — Other Ambulatory Visit: Payer: Medicare Other

## 2018-09-30 ENCOUNTER — Other Ambulatory Visit: Payer: Medicare Other

## 2018-09-30 ENCOUNTER — Ambulatory Visit: Payer: Medicare Other

## 2018-10-16 ENCOUNTER — Other Ambulatory Visit: Payer: Self-pay

## 2018-10-16 ENCOUNTER — Ambulatory Visit
Admission: RE | Admit: 2018-10-16 | Discharge: 2018-10-16 | Disposition: A | Discharge status: Home / Self Care | Payer: Medicare Other | Source: Ambulatory Visit | Attending: Internal Medicine | Admitting: Internal Medicine

## 2018-10-16 DIAGNOSIS — R928 Other abnormal and inconclusive findings on diagnostic imaging of breast: Secondary | ICD-10-CM

## 2019-04-02 ENCOUNTER — Other Ambulatory Visit: Payer: Self-pay | Admitting: Internal Medicine

## 2019-04-02 DIAGNOSIS — N6310 Unspecified lump in the right breast, unspecified quadrant: Secondary | ICD-10-CM

## 2019-04-15 ENCOUNTER — Ambulatory Visit
Admission: RE | Admit: 2019-04-15 | Discharge: 2019-04-15 | Disposition: A | Discharge status: Home / Self Care | Payer: Medicare Other | Source: Ambulatory Visit | Attending: Internal Medicine | Admitting: Internal Medicine

## 2019-04-15 DIAGNOSIS — N6310 Unspecified lump in the right breast, unspecified quadrant: Secondary | ICD-10-CM | POA: Diagnosis present

## 2019-04-15 DIAGNOSIS — N6001 Solitary cyst of right breast: Secondary | ICD-10-CM | POA: Diagnosis not present

## 2019-10-27 ENCOUNTER — Encounter (INDEPENDENT_AMBULATORY_CARE_PROVIDER_SITE_OTHER): Payer: Self-pay | Admitting: Ophthalmology

## 2019-10-27 ENCOUNTER — Other Ambulatory Visit: Payer: Self-pay

## 2019-10-27 ENCOUNTER — Ambulatory Visit (INDEPENDENT_AMBULATORY_CARE_PROVIDER_SITE_OTHER): Payer: Medicare Other | Admitting: Ophthalmology

## 2019-10-27 DIAGNOSIS — H35351 Cystoid macular degeneration, right eye: Secondary | ICD-10-CM | POA: Diagnosis not present

## 2019-10-27 DIAGNOSIS — H353132 Nonexudative age-related macular degeneration, bilateral, intermediate dry stage: Secondary | ICD-10-CM | POA: Diagnosis not present

## 2019-10-27 DIAGNOSIS — H35721 Serous detachment of retinal pigment epithelium, right eye: Secondary | ICD-10-CM | POA: Diagnosis not present

## 2019-10-27 DIAGNOSIS — H353133 Nonexudative age-related macular degeneration, bilateral, advanced atrophic without subfoveal involvement: Secondary | ICD-10-CM | POA: Insufficient documentation

## 2019-10-27 DIAGNOSIS — H353211 Exudative age-related macular degeneration, right eye, with active choroidal neovascularization: Secondary | ICD-10-CM | POA: Diagnosis not present

## 2019-10-27 DIAGNOSIS — H353212 Exudative age-related macular degeneration, right eye, with inactive choroidal neovascularization: Secondary | ICD-10-CM | POA: Insufficient documentation

## 2019-10-27 MED ORDER — BEVACIZUMAB CHEMO INJECTION 1.25MG/0.05ML SYRINGE FOR KALEIDOSCOPE
1.2500 mg | INTRAVITREAL | Status: AC | PRN
  Administered 2019-10-27: 1.25 mg via INTRAVITREAL

## 2019-10-27 NOTE — Progress Notes (Signed)
10/27/2019     CHIEF COMPLAINT Patient presents for Retina Follow Up   HISTORY OF PRESENT ILLNESS: Crystal Blanchard is a 86 y.o. female who presents to the clinic today for:   HPI    Retina Follow Up    Patient presents with  Wet AMD.  In both eyes.  Severity is moderate.  Duration of 9 weeks.  Since onset it is gradually worsening.  I, the attending physician,  performed the HPI with the patient and updated documentation appropriately.          Comments    9 Week AMD f\u OU. Possible Avastin OD. OCT  Pt states vision is stable. Pt has no new complaints.       Last edited by Hurman Horn, MD on 10/27/2019 11:11 AM. (History)      Referring physician: Albina Billet, MD [address]   [place],   [address]  HISTORICAL INFORMATION:   Selected notes from the MEDICAL RECORD NUMBER       CURRENT MEDICATIONS: No current outpatient medications on file. (Ophthalmic Drugs)   No current facility-administered medications for this visit. (Ophthalmic Drugs)   Current Outpatient Medications (Other)  Medication Sig  . aspirin 81 MG tablet Take 81 mg by mouth daily.  . B Complex-C-Folic Acid (STRESS FORMULA) TABS Take 1 tablet by mouth daily.  . benazepril (LOTENSIN) 40 MG tablet Take 40 mg by mouth every morning.   . Cholecalciferol (VITAMIN D3) 2000 UNITS TABS Take 1 tablet by mouth daily.  Marland Kitchen doxazosin (CARDURA) 8 MG tablet Take 8 mg by mouth at bedtime.   . hydrochlorothiazide (HYDRODIURIL) 25 MG tablet Take 25 mg by mouth daily.   Marland Kitchen HYDROcodone-acetaminophen (NORCO) 5-325 MG tablet Take 1-2 tablets by mouth every 4 (four) hours as needed for moderate pain. (Patient not taking: Reported on 03/01/2016)  . levothyroxine (SYNTHROID, LEVOTHROID) 75 MCG tablet Take 75 mcg by mouth daily before breakfast.  . Multiple Vitamins-Minerals (PRESERVISION AREDS 2 PO) Take 1 tablet by mouth 2 (two) times daily.   No current facility-administered medications for this visit. (Other)      REVIEW OF SYSTEMS:    ALLERGIES No Known Allergies  PAST MEDICAL HISTORY Past Medical History:  Diagnosis Date  . Arthritis   . Breast cancer [facility]) 2011   Wide excision, radiation therapy, tamoxifen. Intermediate grade DCIS.  Marland Kitchen Elevated blood sugar   . Hyperlipidemia   . Hypertension 1985  . Hypothyroidism   . Macular degeneration 2013   white macular degeneration-RECEIVES EYE INJECTIONS EVERY 5 WEEKS  . Malignant neoplasm of upper-outer quadrant of female breast [facility]) January 18, 2010   DCIS, 6 mm.core biopsy dated December 28, 2009 showed florid ductal hyperplasia with focal atypia. ER 90%, PR 90%..  . Personal history of radiation therapy   . Skin cancer   . Thyroid disease 1996  . Vertigo    Past Surgical History:  Procedure Laterality Date  . ABDOMINAL HYSTERECTOMY  1968  . BREAST BIOPSY Right 12/2009   Wide excision DCIS., Mammosite placement August 2011.  Marland Kitchen BREAST LUMPECTOMY Right 2011   DCIS  . CATARACT EXTRACTION    . CHOLECYSTECTOMY N/A 05/28/2015   Procedure: LAPAROSCOPIC CHOLECYSTECTOMY WITH INTRAOPERATIVE CHOLANGIOGRAM;  Surgeon: Robert Bellow, MD;  Location: [facility] ORS;  Service: General;  Laterality: N/A;  . COLONOSCOPY  2011   Dr Vira Agar [facility]  . EYE SURGERY Left 2009  . EYE SURGERY Right 2010    FAMILY  HISTORY Family History  Problem Relation Age of Onset  . Heart failure Mother   . Cancer Father        bone  . Breast cancer Sister 64    SOCIAL HISTORY Social History   Tobacco Use  . Smoking status: Never Smoker  . Smokeless tobacco: Never Used  Substance Use Topics  . Alcohol use: No  . Drug use: No         OPHTHALMIC EXAM: Base Eye Exam    Visual Acuity (Snellen - Linear)      Right Left   Dist [place] 20/200 20/80 +   Dist ph [place] 20/40 -1 20/40 -2       Tonometry (Tonopen, 9:57 AM)      Right Left   Pressure 13 10       Pupils      Pupils Dark Light Shape React APD   Right PERRL 3 2.5 Round Brisk None   Left PERRL 3 2.5  Round Brisk None       Visual Fields (Counting fingers)      Left Right    Full Full       Neuro/Psych    Oriented x3: Yes   Mood/Affect: Normal       Dilation    Both eyes: 1.0% Mydriacyl, 2.5% Phenylephrine @ 9:57 AM        Slit Lamp and Fundus Exam    Slit Lamp Exam      Right Left   Lens Posterior chamber intraocular lens Posterior chamber intraocular lens          IMAGING AND PROCEDURES  Imaging and Procedures for 10/27/19  OCT, Retina - OU - Both Eyes       Right Eye Quality was good. Scan locations included subfoveal. Central Foveal Thickness: 237.   Left Eye Quality was good. Scan locations included subfoveal. Central Foveal Thickness: 260.                 ASSESSMENT/PLAN:  No problem-specific Assessment & Plan notes found for this encounter.      ICD-10-CM   1. Exudative age-related macular degeneration of right eye with active choroidal neovascularization (HCC)  H35.3211 OCT, Retina - OU - Both Eyes  2. Serous detachment of retinal pigment epithelium of right eye  H35.721   3. Intermediate stage nonexudative age-related macular degeneration of both eyes  H35.3132 OCT, Retina - OU - Both Eyes  4. Cystoid macular edema of right eye  H35.351     1.Repeat intravitreal Avastin OD today and examination in 10 weeks  2.  3.  Ophthalmic Meds Ordered this visit:  No orders of the defined types were placed in this encounter.      No follow-ups on file.  There are no Patient Instructions on file for this visit.   Explained the diagnoses, plan, and follow up with the patient and they expressed understanding.  Patient expressed understanding of the importance of proper follow up care.   Clent Demark Rankin M.D. Diseases & Surgery of the Retina and Vitreous Retina & Diabetic [place] 10/27/19     Abbreviations: M myopia (nearsighted); A astigmatism; H hyperopia (farsighted); P presbyopia; Mrx spectacle prescription;  CTL contact lenses;  OD right eye; OS left eye; OU both eyes  XT exotropia; ET esotropia; PEK punctate epithelial keratitis; PEE punctate epithelial erosions; DES dry eye syndrome; MGD meibomian gland dysfunction; ATs artificial tears; PFAT's preservative free artificial tears; [place] nuclear sclerotic cataract; PSC posterior  subcapsular cataract; ERM epi-retinal membrane; PVD posterior vitreous detachment; RD retinal detachment; DM diabetes mellitus; DR diabetic retinopathy; NPDR non-proliferative diabetic retinopathy; PDR proliferative diabetic retinopathy; CSME clinically significant macular edema; DME diabetic macular edema; dbh dot blot hemorrhages; CWS cotton wool spot; POAG primary open angle glaucoma; C/D cup-to-disc ratio; HVF humphrey visual field; GVF goldmann visual field; OCT optical coherence tomography; IOP intraocular pressure; BRVO Branch retinal vein occlusion; CRVO central retinal vein occlusion; CRAO central retinal artery occlusion; BRAO branch retinal artery occlusion; RT retinal tear; SB scleral buckle; PPV pars plana vitrectomy; VH Vitreous hemorrhage; PRP panretinal laser photocoagulation; IVK intravitreal kenalog; VMT vitreomacular traction; MH Macular hole;  NVD neovascularization of the disc; NVE neovascularization elsewhere; AREDS age related eye disease study; ARMD age related macular degeneration; POAG primary open angle glaucoma; EBMD epithelial/anterior basement membrane dystrophy; ACIOL anterior chamber intraocular lens; IOL intraocular lens; PCIOL posterior chamber intraocular lens; Phaco/IOL phacoemulsification with intraocular lens placement; Duck photorefractive keratectomy; LASIK laser assisted in situ keratomileusis; HTN hypertension; DM diabetes mellitus; COPD chronic obstructive pulmonary disease

## 2019-10-27 NOTE — Assessment & Plan Note (Signed)
The nature of wet macular degeneration was discussed with the patient.  Forms of therapy reviewed include the use of Anti-VEGF medications injected painlessly into the eye, as well as other possible treatment modalities, including thermal laser therapy. Fellow eye involvement and risks were discussed with the patient. Upon the finding of wet age related macular degeneration, treatment will be offered. The treatment regimen is on a treat as needed basis with the intent to treat if necessary and extend interval of exams when possible. On average 1 out of 6 patients do not need lifetime therapy. However, the risk of recurrent disease is high for a lifetime.  Initially monthly, then periodic, examinations and evaluations will determine whether the next treatment is required on the day of the examination.  OD, currently at 9-week interval of examination.  We will repeat intravitreal Avastin treatment today and examination in 10 weeks

## 2020-01-05 ENCOUNTER — Ambulatory Visit (INDEPENDENT_AMBULATORY_CARE_PROVIDER_SITE_OTHER): Payer: Medicare Other | Admitting: Ophthalmology

## 2020-01-05 ENCOUNTER — Other Ambulatory Visit: Payer: Self-pay

## 2020-01-05 ENCOUNTER — Encounter (INDEPENDENT_AMBULATORY_CARE_PROVIDER_SITE_OTHER): Payer: Self-pay | Admitting: Ophthalmology

## 2020-01-05 DIAGNOSIS — H353211 Exudative age-related macular degeneration, right eye, with active choroidal neovascularization: Secondary | ICD-10-CM

## 2020-01-05 DIAGNOSIS — H353132 Nonexudative age-related macular degeneration, bilateral, intermediate dry stage: Secondary | ICD-10-CM | POA: Diagnosis not present

## 2020-01-05 MED ORDER — BEVACIZUMAB CHEMO INJECTION 1.25MG/0.05ML SYRINGE FOR KALEIDOSCOPE
1.2500 mg | INTRAVITREAL | Status: AC | PRN
  Administered 2020-01-05: 1.25 mg via INTRAVITREAL

## 2020-01-05 NOTE — Progress Notes (Signed)
01/05/2020     CHIEF COMPLAINT Patient presents for Retina Follow Up   HISTORY OF PRESENT ILLNESS: Crystal Blanchard is a 86 y.o. female who presents to the clinic today for:   HPI    Retina Follow Up    Patient presents with  Wet AMD.  In right eye.  Severity is moderate.  Duration of 10 weeks.  Since onset it is stable.  I, the attending physician,  performed the HPI with the patient and updated documentation appropriately.          Comments    10 Week AMD f\u OD. Possible Avastin OD. OCT  Pt states no changes or issues.        Last edited by Tilda Franco on 01/05/2020 10:07 AM. (History)      Referring physician: Albina Billet, MD [address]   [place],  [place] [address]  HISTORICAL INFORMATION:   Selected notes from the MEDICAL RECORD NUMBER       CURRENT MEDICATIONS: No current outpatient medications on file. (Ophthalmic Drugs)   No current facility-administered medications for this visit. (Ophthalmic Drugs)   Current Outpatient Medications (Other)  Medication Sig  . aspirin 81 MG tablet Take 81 mg by mouth daily.  . B Complex-C-Folic Acid (STRESS FORMULA) TABS Take 1 tablet by mouth daily.  . benazepril (LOTENSIN) 40 MG tablet Take 40 mg by mouth every morning.   . Cholecalciferol (VITAMIN D3) 2000 UNITS TABS Take 1 tablet by mouth daily.  Marland Kitchen doxazosin (CARDURA) 8 MG tablet Take 8 mg by mouth at bedtime.   . hydrochlorothiazide (HYDRODIURIL) 25 MG tablet Take 25 mg by mouth daily.   Marland Kitchen HYDROcodone-acetaminophen (NORCO) 5-325 MG tablet Take 1-2 tablets by mouth every 4 (four) hours as needed for moderate pain. (Patient not taking: Reported on 03/01/2016)  . levothyroxine (SYNTHROID, LEVOTHROID) 75 MCG tablet Take 75 mcg by mouth daily before breakfast.  . Multiple Vitamins-Minerals (PRESERVISION AREDS 2 PO) Take 1 tablet by mouth 2 (two) times daily.   No current facility-administered medications for this visit. (Other)      REVIEW OF  SYSTEMS:    ALLERGIES No Known Allergies  PAST MEDICAL HISTORY Past Medical History:  Diagnosis Date  . Arthritis   . Breast cancer [facility]) 2011   Wide excision, radiation therapy, tamoxifen. Intermediate grade DCIS.  Marland Kitchen Elevated blood sugar   . Hyperlipidemia   . Hypertension 1985  . Hypothyroidism   . Macular degeneration 2013   white macular degeneration-RECEIVES EYE INJECTIONS EVERY 5 WEEKS  . 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 ER 90%, PR 90%..  . Personal history of radiation therapy   . Skin cancer   . Thyroid disease 1996  . Vertigo    Past Surgical History:  Procedure Laterality Date  . ABDOMINAL HYSTERECTOMY  1968  . BREAST BIOPSY Right 12/2009   Wide excision DCIS., Mammosite placement August 2011.  Marland Kitchen BREAST LUMPECTOMY Right 2011   DCIS  . CATARACT EXTRACTION    . CHOLECYSTECTOMY N/A 05/28/2015   Procedure: LAPAROSCOPIC CHOLECYSTECTOMY WITH INTRAOPERATIVE CHOLANGIOGRAM;  Surgeon: Robert Bellow, MD;  Location: [facility] ORS;  Service: General;  Laterality: N/A;  . COLONOSCOPY  2011   Dr Vira Agar [facility]  . EYE SURGERY Left 2009  . EYE SURGERY Right 2010    FAMILY HISTORY Family History  Problem Relation Age of Onset  . Heart failure Mother   . Cancer Father        bone  . Breast cancer Sister 71    SOCIAL HISTORY Social History   Tobacco Use  . Smoking status: Never Smoker  . Smokeless tobacco: Never Used  Substance Use Topics  . Alcohol use: No  . Drug use: No         OPHTHALMIC EXAM:  Base Eye Exam    Visual Acuity (Snellen - Linear)      Right Left   Dist [place] E Card @ 5' 20/100   Dist ph [place] 20/50 20/50 +       Tonometry (Tonopen, 10:12 AM)      Right Left   Pressure 12 14       Pupils      Pupils Dark Light Shape React APD   Right PERRL 3 2.5 Round Brisk None   Left PERRL 3 2.5 Round Brisk  None       Visual Fields (Counting fingers)      Left Right    Full Full       Neuro/Psych    Oriented x3: Yes   Mood/Affect: Normal       Dilation    Right eye: 1.0% Mydriacyl, 2.5% Phenylephrine @ 10:12 AM        Slit Lamp and Fundus Exam    External Exam      Right Left   External Normal Normal       Slit Lamp Exam      Right Left   Lids/Lashes Normal Normal   Conjunctiva/Sclera White and quiet White and quiet   Cornea Clear Clear   Anterior Chamber Deep and quiet Deep and quiet   Iris Round and reactive Round and reactive   Lens Posterior chamber intraocular lens Posterior chamber intraocular lens   Anterior Vitreous Normal Normal       Fundus Exam      Right Left   Posterior Vitreous Normal    Disc Normal    C/D Ratio 0.2    Macula Hard drusen, Retinal pigment epithelial mottling, no macular thickening    Vessels Normal    Periphery Normal           IMAGING AND PROCEDURES  Imaging and Procedures for 01/05/20  OCT, Retina - OU - Both Eyes       Right Eye Quality was good. Scan locations included subfoveal. Central Foveal Thickness: 238. Progression has improved. Findings include no SRF, abnormal foveal contour, no IRF, pigment epithelial detachment.   Left Eye Quality was good. Scan locations included subfoveal. Central Foveal Thickness: 259. Findings include abnormal foveal contour, retinal drusen , no SRF, no IRF.   Notes OD, much less active CN VM, at 10-week interval today.  We will repeat injection Avastin OD today and examination OU in 3 months       Intravitreal Injection, Pharmacologic Agent - OD - Right Eye       Time Out 01/05/2020. 10:47 AM. Confirmed correct patient, procedure, site, and patient consented.   Anesthesia Topical anesthesia was used. Anesthetic medications included Akten 3.5%.   Procedure Preparation included Ofloxacin , Tobramycin 0.3%, 10% betadine to eyelids, 5% betadine to ocular surface. A 30 gauge needle  was used.   Injection:  1.25 mg Bevacizumab (AVASTIN) SOLN   NDC: [address]-0, Lot: [id number]   Route: Intravitreal, Site: Right Eye, Waste: 0 mg  Post-op Post injection exam found  visual acuity of at least counting fingers. The patient tolerated the procedure well. There were no complications. The patient received written and verbal post procedure care education. Post injection medications were not given.                 ASSESSMENT/PLAN:  Intermediate stage nonexudative age-related macular degeneration of both eyes The nature of age--related macular degeneration was discussed with the patient as well as the distinction between dry and wet types. Checking an Amsler Grid daily with advice to return immediately should a distortion develop, was given to the patient. The patient 's smoking status now and in the past was determined and advice based on the AREDS study was provided regarding the consumption of antioxidant supplements. AREDS 2 vitamin formulation was recommended. Consumption of dark leafy vegetables and fresh fruits of various colors was recommended. Treatment modalities for wet macular degeneration particularly the use of intravitreal injections of anti-blood vessel growth factors was discussed with the patient. Avastin, Lucentis, and Eylea are the available options. On occasion, therapy includes the use of photodynamic therapy and thermal laser. Stressed to the patient do not rub eyes.  Patient was advised to check Amsler Grid daily and return immediately if changes are noted. Instructions on using the grid were given to the patient. All patient questions were answered.  Exudative age-related macular degeneration of right eye with active choroidal neovascularization (HCC) OD, much less active CN VM, at 10-week interval today.  We will repeat injection Avastin OD today and examination OU in 3 months      ICD-10-CM   1. Exudative age-related macular degeneration of right eye with  active choroidal neovascularization (HCC)  H35.3211 OCT, Retina - OU - Both Eyes    Intravitreal Injection, Pharmacologic Agent - OD - Right Eye    Bevacizumab (AVASTIN) SOLN 1.25 mg  2. Intermediate stage nonexudative age-related macular degeneration of both eyes  H35.3132     1.  Repeat injection intravitreal Avastin OD today  2.  Exam OU in 3 months and likely injection OD intravitreal Avastin  3.  Ophthalmic Meds Ordered this visit:  Meds ordered this encounter  Medications  . Bevacizumab (AVASTIN) SOLN 1.25 mg       Return in about 3 months (around 04/06/2020) for DILATE OU, AVASTIN OCT, OD.  There are no Patient Instructions on file for this visit.   Explained the diagnoses, plan, and follow up with the patient and they expressed understanding.  Patient expressed understanding of the importance of proper follow up care.   Clent Demark Jezabelle Chisolm M.D. Diseases & Surgery of the Retina and Vitreous Retina & Diabetic [place] 01/05/20     Abbreviations: M myopia (nearsighted); A astigmatism; H hyperopia (farsighted); P presbyopia; Mrx spectacle prescription;  CTL contact lenses; OD right eye; OS left eye; OU both eyes  XT exotropia; ET esotropia; PEK punctate epithelial keratitis; PEE punctate epithelial erosions; DES dry eye syndrome; MGD meibomian gland dysfunction; ATs artificial tears; PFAT's preservative free artificial tears; [place] nuclear sclerotic cataract; PSC posterior subcapsular cataract; ERM epi-retinal membrane; PVD posterior vitreous detachment; RD retinal detachment; DM diabetes mellitus; DR diabetic retinopathy; NPDR non-proliferative diabetic retinopathy; PDR proliferative diabetic retinopathy; CSME clinically significant macular edema; DME diabetic macular edema; dbh dot blot hemorrhages; CWS cotton wool spot; POAG primary open angle glaucoma; C/D cup-to-disc ratio; HVF humphrey visual field; GVF goldmann visual field; OCT optical coherence tomography; IOP intraocular  pressure; BRVO Branch retinal vein occlusion; CRVO central retinal vein occlusion; CRAO central retinal  artery occlusion; BRAO branch retinal artery occlusion; RT retinal tear; SB scleral buckle; PPV pars plana vitrectomy; VH Vitreous hemorrhage; PRP panretinal laser photocoagulation; IVK intravitreal kenalog; VMT vitreomacular traction; MH Macular hole;  NVD neovascularization of the disc; NVE neovascularization elsewhere; AREDS age related eye disease study; ARMD age related macular degeneration; POAG primary open angle glaucoma; EBMD epithelial/anterior basement membrane dystrophy; ACIOL anterior chamber intraocular lens; IOL intraocular lens; PCIOL posterior chamber intraocular lens; Phaco/IOL phacoemulsification with intraocular lens placement; [place] photorefractive keratectomy; LASIK laser assisted in situ keratomileusis; HTN hypertension; DM diabetes mellitus; COPD chronic obstructive pulmonary disease

## 2020-01-05 NOTE — Assessment & Plan Note (Signed)

## 2020-01-05 NOTE — Assessment & Plan Note (Signed)
OD, much less active CN VM, at 10-week interval today.  We will repeat injection Avastin OD today and examination OU in 3 months

## 2020-03-23 ENCOUNTER — Other Ambulatory Visit: Payer: Self-pay | Admitting: Internal Medicine

## 2020-03-23 DIAGNOSIS — N63 Unspecified lump in unspecified breast: Secondary | ICD-10-CM

## 2020-04-06 ENCOUNTER — Ambulatory Visit (INDEPENDENT_AMBULATORY_CARE_PROVIDER_SITE_OTHER): Payer: Medicare Other | Admitting: Ophthalmology

## 2020-04-06 ENCOUNTER — Encounter (INDEPENDENT_AMBULATORY_CARE_PROVIDER_SITE_OTHER): Payer: Self-pay | Admitting: Ophthalmology

## 2020-04-06 ENCOUNTER — Other Ambulatory Visit: Payer: Self-pay

## 2020-04-06 DIAGNOSIS — H353132 Nonexudative age-related macular degeneration, bilateral, intermediate dry stage: Secondary | ICD-10-CM | POA: Diagnosis not present

## 2020-04-06 DIAGNOSIS — H43813 Vitreous degeneration, bilateral: Secondary | ICD-10-CM | POA: Insufficient documentation

## 2020-04-06 DIAGNOSIS — H353211 Exudative age-related macular degeneration, right eye, with active choroidal neovascularization: Secondary | ICD-10-CM

## 2020-04-06 DIAGNOSIS — H35721 Serous detachment of retinal pigment epithelium, right eye: Secondary | ICD-10-CM | POA: Diagnosis not present

## 2020-04-06 MED ORDER — BEVACIZUMAB CHEMO INJECTION 1.25MG/0.05ML SYRINGE FOR KALEIDOSCOPE
1.2500 mg | INTRAVITREAL | Status: AC | PRN
  Administered 2020-04-06: 1.25 mg via INTRAVITREAL

## 2020-04-06 NOTE — Assessment & Plan Note (Signed)

## 2020-04-06 NOTE — Progress Notes (Signed)
04/06/2020     CHIEF COMPLAINT Patient presents for Retina Follow Up   HISTORY OF PRESENT ILLNESS: Crystal Blanchard is a 86 y.o. female who presents to the clinic today for:   HPI    Retina Follow Up    Patient presents with  Wet AMD.  In right eye.  Severity is moderate.  Duration of 3 months.  Since onset it is stable.  I, the attending physician,  performed the HPI with the patient and updated documentation appropriately.          Comments    3 Month f\u OU. Possible Avastin OD. OCT  Pt states vision is about the same. Pt has not noticed any changes.       Last edited by Tilda Franco on 04/06/2020 10:15 AM. (History)      Referring physician: Albina Billet, MD [address]   [place],  [place] [address]  HISTORICAL INFORMATION:   Selected notes from the MEDICAL RECORD NUMBER       CURRENT MEDICATIONS: No current outpatient medications on file. (Ophthalmic Drugs)   No current facility-administered medications for this visit. (Ophthalmic Drugs)   Current Outpatient Medications (Other)  Medication Sig  . aspirin 81 MG tablet Take 81 mg by mouth daily.  . B Complex-C-Folic Acid (STRESS FORMULA) TABS Take 1 tablet by mouth daily.  . benazepril (LOTENSIN) 40 MG tablet Take 40 mg by mouth every morning.   . Cholecalciferol (VITAMIN D3) 2000 UNITS TABS Take 1 tablet by mouth daily.  Marland Kitchen doxazosin (CARDURA) 8 MG tablet Take 8 mg by mouth at bedtime.   . hydrochlorothiazide (HYDRODIURIL) 25 MG tablet Take 25 mg by mouth daily.   Marland Kitchen HYDROcodone-acetaminophen (NORCO) 5-325 MG tablet Take 1-2 tablets by mouth every 4 (four) hours as needed for moderate pain. (Patient not taking: Reported on 03/01/2016)  . levothyroxine (SYNTHROID, LEVOTHROID) 75 MCG tablet Take 75 mcg by mouth daily before breakfast.  . Multiple Vitamins-Minerals (PRESERVISION AREDS 2 PO) Take 1 tablet by mouth 2 (two) times daily.   No current facility-administered medications for this visit. (Other)        REVIEW OF SYSTEMS:    ALLERGIES No Known Allergies  PAST MEDICAL HISTORY Past Medical History:  Diagnosis Date  . Arthritis   . Breast cancer [facility]) 2011   Wide excision, radiation therapy, tamoxifen. Intermediate grade DCIS.  Marland Kitchen Elevated blood sugar   . Hyperlipidemia   . Hypertension 1985  . Hypothyroidism   . Macular degeneration 2013   white macular degeneration-RECEIVES EYE INJECTIONS EVERY 5 WEEKS  . 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 ER 90%, PR 90%..  . Personal history of radiation therapy   . Skin cancer   . Thyroid disease 1996  . Vertigo    Past Surgical History:  Procedure Laterality Date  . ABDOMINAL HYSTERECTOMY  1968  . BREAST BIOPSY Right 12/2009   Wide excision DCIS., Mammosite placement August 2011.  Marland Kitchen BREAST LUMPECTOMY Right 2011   DCIS  . CATARACT EXTRACTION    . CHOLECYSTECTOMY N/A 05/28/2015   Procedure: LAPAROSCOPIC CHOLECYSTECTOMY WITH INTRAOPERATIVE CHOLANGIOGRAM;  Surgeon: Robert Bellow, MD;  Location: [facility] ORS;  Service: General;  Laterality: N/A;  . COLONOSCOPY  2011   Dr Vira Agar [facility]  . EYE SURGERY Left 2009  . EYE SURGERY Right 2010  FAMILY HISTORY Family History  Problem Relation Age of Onset  . Heart failure Mother   . Cancer Father        bone  . Breast cancer Sister 53    SOCIAL HISTORY Social History   Tobacco Use  . Smoking status: Never Smoker  . Smokeless tobacco: Never Used  Substance Use Topics  . Alcohol use: No  . Drug use: No         OPHTHALMIC EXAM:  Base Eye Exam    Visual Acuity (Snellen - Linear)      Right Left   Dist [place] 20/400 20/80 -2   Dist ph [place] 20/40 -2 20/40 -2       Tonometry (Tonopen, 10:21 AM)      Right Left   Pressure 13 11       Pupils      Pupils Dark Light Shape React APD   Right PERRL 3 2 Round Slow None   Left PERRL 3  2 Round Slow None       Visual Fields (Counting fingers)      Left Right     Full   Restrictions Partial outer superior temporal deficiency        Neuro/Psych    Oriented x3: Yes   Mood/Affect: Normal       Dilation    Both eyes: 1.0% Mydriacyl, 2.5% Phenylephrine @ 10:21 AM        Slit Lamp and Fundus Exam    External Exam      Right Left   External Normal Normal       Slit Lamp Exam      Right Left   Lids/Lashes Normal Normal   Conjunctiva/Sclera White and quiet White and quiet   Cornea Clear Clear   Anterior Chamber Deep and quiet Deep and quiet   Iris Round and reactive Round and reactive   Lens Posterior chamber intraocular lens Posterior chamber intraocular lens   Anterior Vitreous Normal Normal       Fundus Exam      Right Left   Posterior Vitreous Normal Posterior vitreous detachment   Disc Normal Normal   C/D Ratio 0.2 0.15   Macula Hard drusen, Retinal pigment epithelial mottling, no macular thickening, Soft drusen Retinal pigment epithelial mottling, no macular thickening, Atrophy   Vessels Normal Normal   Periphery Normal Normal          IMAGING AND PROCEDURES  Imaging and Procedures for 04/06/20  OCT, Retina - OU - Both Eyes       Right Eye Quality was good. Scan locations included subfoveal. Central Foveal Thickness: 230. Progression has been stable. Findings include no SRF, no IRF, pigment epithelial detachment, subretinal hyper-reflective material.   Left Eye Quality was good. Scan locations included subfoveal. Central Foveal Thickness: 254. Progression has been stable. Findings include no SRF, no IRF.   Notes Pigment epithelial detachment OD, much less intraretinal and subretinal fluid  No signs of CNVM       Intravitreal Injection, Pharmacologic Agent - OD - Right Eye       Time Out 04/06/2020. 11:18 AM. Confirmed correct patient, procedure, site, and patient consented.   Anesthesia Topical anesthesia was used. Anesthetic  medications included Akten 3.5%.   Procedure Preparation included 10% betadine to eyelids, 5% betadine to ocular surface, Tobramycin 0.3%. A 30 gauge needle was used.   Injection:  1.25 mg Bevacizumab (AVASTIN) SOLN   NDC: L7022680, Lot: [id number]   Route: Intravitreal,  Site: Right Eye, Waste: 0 mg  Post-op Post injection exam found visual acuity of at least counting fingers. The patient tolerated the procedure well. There were no complications. The patient received written and verbal post procedure care education. Post injection medications were not given.                 ASSESSMENT/PLAN:  Exudative age-related macular degeneration of right eye with active choroidal neovascularization (HCC) Today at 3 months status post injection, 6 weeks after medication effect has waned.  Thus repeat injection today because of history of multiple recurrences over the years and extend interval examination to 4 months prior to potentially observation alone  Serous detachment of retinal pigment epithelium of right eye Improved on therapy, not active today  Intermediate stage nonexudative age-related macular degeneration of both eyes No signs of CNVM OS  Posterior vitreous detachment of both eyes   The nature of posterior vitreous detachment was discussed with the patient as well as its physiology, its age prevalence, and its possible implication regarding retinal breaks and detachment.  An informational brochure was given to the patient.  All the patient's questions were answered.  The patient was asked to return if new or different flashes or floaters develops.   Patient was instructed to contact office immediately if any changes were noticed. I explained to the patient that vitreous inside the eye is similar to jello inside a bowl. As the jello melts it can start to pull away from the bowl, similarly the vitreous throughout our lives can begin to pull away from the retina. That process is called  a posterior vitreous detachment. In some cases, the vitreous can tug hard enough on the retina to form a retinal tear. I discussed with the patient the signs and symptoms of a retinal detachment.  Do not rub the eye.      ICD-10-CM   1. 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 Serous detachment of retinal pigment epithelium of right eye  H35.721   3. Intermediate stage nonexudative age-related macular degeneration of both eyes  H35.3132   4. Posterior vitreous detachment of both eyes  H43.813     1.  Repeat intravitreal Avastin OD today, off medication therapy OD for 6 weeks.  Will extend interval examination next to 4 months.  If no signs of CNVM recurrence that would be 2.5 months off of therapy.  May consider observation next.  2.  3.  Ophthalmic Meds Ordered this visit:  Meds ordered this encounter  Medications  . Bevacizumab (AVASTIN) SOLN 1.25 mg       Return in about 4 months (around 08/07/2020) for DILATE OU, OCT, no planned injection.  There are no Patient Instructions on file for this visit.   Explained the diagnoses, plan, and follow up with the patient and they expressed understanding.  Patient expressed understanding of the importance of proper follow up care.   Clent Demark Jaylenne Hamelin M.D. Diseases & Surgery of the Retina and Vitreous Retina & Diabetic [place] 04/06/20     Abbreviations: M myopia (nearsighted); A astigmatism; H hyperopia (farsighted); P presbyopia; Mrx spectacle prescription;  CTL contact lenses; OD right eye; OS left eye; OU both eyes  XT exotropia; ET esotropia; PEK punctate epithelial keratitis; PEE punctate epithelial erosions; DES  dry eye syndrome; MGD meibomian gland dysfunction; ATs artificial tears; PFAT's preservative free artificial  tears; [place] nuclear sclerotic cataract; PSC posterior subcapsular cataract; ERM epi-retinal membrane; PVD posterior vitreous detachment; RD retinal detachment; DM diabetes mellitus; DR diabetic retinopathy; NPDR non-proliferative diabetic retinopathy; PDR proliferative diabetic retinopathy; CSME clinically significant macular edema; DME diabetic macular edema; dbh dot blot hemorrhages; CWS cotton wool spot; POAG primary open angle glaucoma; C/D cup-to-disc ratio; HVF humphrey visual field; GVF goldmann visual field; OCT optical coherence tomography; IOP intraocular pressure; BRVO Branch retinal vein occlusion; CRVO central retinal vein occlusion; CRAO central retinal artery occlusion; BRAO branch retinal artery occlusion; RT retinal tear; SB scleral buckle; PPV pars plana vitrectomy; VH Vitreous hemorrhage; PRP panretinal laser photocoagulation; IVK intravitreal kenalog; VMT vitreomacular traction; MH Macular hole;  NVD neovascularization of the disc; NVE neovascularization elsewhere; AREDS age related eye disease study; ARMD age related macular degeneration; POAG primary open angle glaucoma; EBMD epithelial/anterior basement membrane dystrophy; ACIOL anterior chamber intraocular lens; IOL intraocular lens; PCIOL posterior chamber intraocular lens; Phaco/IOL phacoemulsification with intraocular lens placement; [place] photorefractive keratectomy; LASIK laser assisted in situ keratomileusis; HTN hypertension; DM diabetes mellitus; COPD chronic obstructive pulmonary disease

## 2020-04-06 NOTE — Assessment & Plan Note (Signed)
Today at 3 months status post injection, 6 weeks after medication effect has waned.  Thus repeat injection today because of history of multiple recurrences over the years and extend interval examination to 4 months prior to potentially observation alone

## 2020-04-06 NOTE — Assessment & Plan Note (Signed)
Improved on therapy, not active today

## 2020-04-06 NOTE — Assessment & Plan Note (Signed)
No signs of CNVM OS

## 2020-04-16 ENCOUNTER — Ambulatory Visit
Admission: RE | Admit: 2020-04-16 | Discharge: 2020-04-16 | Disposition: A | Discharge status: Home / Self Care | Payer: Medicare Other | Source: Ambulatory Visit | Attending: Internal Medicine | Admitting: Internal Medicine

## 2020-04-16 ENCOUNTER — Other Ambulatory Visit: Payer: Self-pay

## 2020-04-16 DIAGNOSIS — N6001 Solitary cyst of right breast: Secondary | ICD-10-CM | POA: Diagnosis not present

## 2020-04-16 DIAGNOSIS — N63 Unspecified lump in unspecified breast: Secondary | ICD-10-CM

## 2020-04-16 DIAGNOSIS — R921 Mammographic calcification found on diagnostic imaging of breast: Secondary | ICD-10-CM | POA: Diagnosis not present

## 2020-04-16 DIAGNOSIS — N6310 Unspecified lump in the right breast, unspecified quadrant: Secondary | ICD-10-CM | POA: Diagnosis present

## 2020-04-19 ENCOUNTER — Other Ambulatory Visit: Payer: Self-pay | Admitting: Internal Medicine

## 2020-04-19 DIAGNOSIS — N6310 Unspecified lump in the right breast, unspecified quadrant: Secondary | ICD-10-CM

## 2020-04-19 DIAGNOSIS — R921 Mammographic calcification found on diagnostic imaging of breast: Secondary | ICD-10-CM

## 2020-04-19 DIAGNOSIS — R928 Other abnormal and inconclusive findings on diagnostic imaging of breast: Secondary | ICD-10-CM

## 2020-04-21 ENCOUNTER — Telehealth: Payer: Self-pay | Admitting: *Deleted

## 2020-04-21 NOTE — Telephone Encounter (Signed)
CALLED PT TO [facility] BX - NO ANSWER NO VM ON EITHER HOME OR CELL #

## 2020-04-27 ENCOUNTER — Ambulatory Visit
Admission: RE | Admit: 2020-04-27 | Discharge: 2020-04-27 | Disposition: A | Discharge status: Home / Self Care | Payer: Medicare Other | Source: Ambulatory Visit | Attending: Internal Medicine | Admitting: Internal Medicine

## 2020-04-27 ENCOUNTER — Other Ambulatory Visit: Payer: Self-pay

## 2020-04-27 DIAGNOSIS — R928 Other abnormal and inconclusive findings on diagnostic imaging of breast: Secondary | ICD-10-CM | POA: Diagnosis present

## 2020-04-27 DIAGNOSIS — R921 Mammographic calcification found on diagnostic imaging of breast: Secondary | ICD-10-CM | POA: Insufficient documentation

## 2020-04-27 DIAGNOSIS — N6310 Unspecified lump in the right breast, unspecified quadrant: Secondary | ICD-10-CM | POA: Insufficient documentation

## 2020-04-27 HISTORY — PX: BREAST BIOPSY: SHX20

## 2020-04-30 LAB — SURGICAL PATHOLOGY

## 2020-05-03 NOTE — Progress Notes (Signed)
Patient ID: Crystal Blanchard, female   DOB: May 30, 1932, 86 y.o.   MRN: [id number] Initiated Navigation. Scheduled and reviewed Dr. Bary Castilla consult appointment 05/05/20 with patient and her daughter Lattie Haw.    Notified Dr. Hall Busing of referral.

## 2020-05-10 ENCOUNTER — Other Ambulatory Visit: Payer: Self-pay | Admitting: General Surgery

## 2020-05-10 NOTE — Progress Notes (Signed)
Subjective:     Patient ID: Crystal Blanchard is a 86 y.o. female.  HPI  The following portions of the patient's history were reviewed and updated as appropriate.  This an established patient is here today for: office visit. Here for evaluation of newly diagnosed right breast cancer, history of right breast cancer in 2011. Stereotatic breast biopsy was 04-27-20. She states last year she had mammograms every 6 months.  She is here with her daughter, Lattie Haw.   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     Review of Systems  Constitutional: Negative for chills, fever and unexpected weight change.  HENT: Negative.   Respiratory: Negative.  Negative for cough.   Cardiovascular: Negative.        Chief Complaint  Patient presents with  . Breast Problem     BP (!) 182/90   Pulse 76   Temp 36.6 C (97.9 F)   Ht 167.6 cm (5\' 6" )   Wt 86.6 kg (191 lb)   LMP  (LMP Unknown)   SpO2 94%   BMI 30.83 kg/m       Past Medical History:  Diagnosis Date  . Arthritis   . Cataract   . Hyperlipidemia   . Hypertension   . Macular degeneration 2013  . Malignant neoplasm of right female breast (CMS-HCC) 2011   6 mm DCIS, ER 90%, PR 90%.  MammoSite radiation, tamoxifen x5 years.  . Personal history of radiation therapy   . Skin cancer   . Thyroid disease 1996   hypothyroidism  . Vertigo           Past Surgical History:  Procedure Laterality Date  . ABDOMINAL HYSTERECTOMY  1968  . breast cancer Right 2011   Wide excision,   . BREAST EXCISIONAL BIOPSY Right 04/27/2020   stereo  . BREAST EXCISIONAL BIOPSY Right 12/2009  . CHOLECYSTECTOMY  2016   [facility], Dr. Bary Castilla  . COLONOSCOPY  2011  . CREATION PERICARDIAL WINDOW N/A 03/06/2016   Procedure: CREATION OF PERICARDIAL WINDOW OR PARTIAL RESECTION FOR DRAINAGE, subxiphoid;  Surgeon: Loma Sender, MD;  Location: [facility]  OPERATING ROOMS;  Service: Cardiothoracic;  Laterality: N/A;  . history of eye surgery Bilateral    2009 left; 2010 right              OB History    Gravida  4   Para  2   Term      Preterm      AB  2   Living        SAB  2   IAB      Ectopic      Molar      Multiple      Live Births          Obstetric Comments  Age at first period  1 Age of first pregnancy 69         Social History          Socioeconomic History  . Marital status: Widowed    Spouse name: Not on file  . Number of children: Not on file  . Years of education: Not on file  . Highest education level: Not on file  Occupational History  . Not on file  Tobacco Use  . Smoking status: Never Smoker  . Smokeless tobacco: Never Used  Substance and Sexual Activity  . Alcohol use: No  . Drug use: No  .  Sexual activity: Not on file  Other Topics Concern  . Not on file  Social History Narrative  . Not on file   Social Determinants of Health   Financial Resource Strain: Not on file  Food Insecurity: Not on file  Transportation Needs: Not on file       No Known Allergies  Current Medications        Current Outpatient Medications  Medication Sig Dispense Refill  . ANTIOX #8/OM3/DHA/EPA/LUT/ZEAX (PRESERVISION AREDS 2, OMEGA-3, ORAL) Take 1 tablet by mouth 2 (two) times daily.      Marland Kitchen aspirin 81 MG EC tablet Take 81 mg by mouth once daily.      . benazepril (LOTENSIN) 40 MG tablet Take 40 mg by mouth once daily.      . cholecalciferol (VITAMIN D3) 2,000 unit tablet Take 2,000 Units by mouth once daily.      Marland Kitchen doxazosin (CARDURA) 8 MG tablet Take 8 mg by mouth nightly.      . FUROsemide (LASIX) 20 MG tablet Take 20 mg by mouth once daily as needed for edema  4  . levothyroxine (SYNTHROID, LEVOTHROID) 75 MCG tablet Take 75 mcg once daily in the morning  4  . multivitamin with iron (STRESS FORMULA) Tab tablet Take 1 tablet by mouth once daily.        No current facility-administered medications for this visit.           Family History  Problem Relation Age of Onset  . Heart failure Mother   . Coronary Artery Disease (Blocked arteries around heart) Mother   . Bone cancer Father   . Coronary Artery Disease (Blocked arteries around heart) Sister   . Breast cancer Sister   . Thyroid disease Brother   . Colon cancer Neg Hx        Objective:   Physical Exam Exam conducted with a chaperone present.  Constitutional:      Appearance: Normal appearance.  Cardiovascular:     Rate and Rhythm: Normal rate and regular rhythm.     Pulses: Normal pulses.     Heart sounds: Normal heart sounds.  Pulmonary:     Effort: Pulmonary effort is normal.     Breath sounds: Normal breath sounds.  Chest:  Breasts:     Right: No axillary adenopathy.     Left: Normal. No axillary adenopathy.        Comments: Right lumpectomy.  Minimal bruising at biopsy site. Musculoskeletal:     Cervical back: Neck supple.  Lymphadenopathy:     Upper Body:     Right upper body: No axillary adenopathy.     Left upper body: No axillary adenopathy.  Skin:    General: Skin is warm and dry.  Neurological:     Mental Status: She is alert and oriented to person, place, and time.  Psychiatric:        Mood and Affect: Mood normal.        Behavior: Behavior normal.     Labs and Radiology:    April 27, 2020 stereotactic biopsy:  DIAGNOSIS:  A. BREAST, RIGHT, LOWER INNER QUADRANT; STEREOTACTIC CORE NEEDLE BIOPSY:  - DUCTAL CARCINOMA IN SITU, LOW GRADE, INVOLVING A RADIAL SCAR, WITH  ASSOCIATED CALCIFICATIONS.  - BACKGROUND FIBROCYSTIC CHANGES, USUAL DUCTAL HYPERPLASIA, AND APOCRINE  CHANGE.  - NEGATIVE FOR INVASIVE CARCINOMA.   Comment:  DCIS is present in 4 of 6 sampled tissue blocks, and measures at least 5  mm in greatest linear extent.  Ancillary estrogen receptor testing is  pending on block A2, and will be reported as  an addendum.   ADDENDUM:  CASE SUMMARY: BREAST BIOMARKER TESTS  TEST(S) PERFORMED:  Estrogen Receptor (ER) Status: POSITIVE      Percentage of cells with nuclear positivity: Greater than 90% left: 5 q.     Average intensity of staining: Strong   In 2011 the patient had an intermediate grade DCIS involving the upper outer quadrant of the right breast.  ER positive.  Treated by wide excision, MammoSite radiation and 5 years of tamoxifen.  Ultrasound exam:  Ultrasound was used to assess the medial aspect of the breast but the patient decided to undergo repeat breast conservation.  The biopsy cavity and clip is well visualized 3 oclock position, above 4-5 cm from the nipple.BIRAD-6.    Assessment:     New foci of DCIS in the right breast, low-grade.    Plan:     The patient is a candidate for the [employer] trial.  In communication with the research protocol director she is not a candidate in spite of her age as well as her history of DCIS in the distant past.  The research protocol was reviewed in detail including the randomization process.  Potential treated off protocol with antiestrogen therapy alone was discussed.  Patient after reflection decided that she wanted to proceed with excision, but unlikely wished to proceed with repeat radiation therapy but was amenable to reinstitution of antiestrogen therapy if indicated.  Informational brochure was provided.  Opportunity for second opinion reviewed.  Patient had undergone uneventful laparoscopic cholecystectomy in late 2016.  In 2017 she underwent [facility].  She is presently asymptomatic in regards to her heart.  We will touch base with her cardiologist, Bartholome Bill, MD prior to surgery. Message received from Dr. Ubaldo Glassing, no contraindication to proceeding with surgical excision (05/07/2020).     Entered by Karie Fetch, RN, acting as a scribe for Dr. Hervey Ard, MD.  The documentation recorded by the  scribe accurately reflects the service I personally performed and the decisions made by me.   Robert Bellow, MD FACS

## 2020-05-26 ENCOUNTER — Other Ambulatory Visit
Admission: RE | Admit: 2020-05-26 | Discharge: 2020-05-26 | Disposition: A | Discharge status: Home / Self Care | Payer: Medicare Other | Source: Ambulatory Visit | Attending: General Surgery | Admitting: General Surgery

## 2020-05-26 ENCOUNTER — Other Ambulatory Visit: Payer: Self-pay

## 2020-05-26 NOTE — Patient Instructions (Signed)
Your procedure is scheduled on: June 02, 2020 Wednesday  Report to the Registration Desk on the 1st floor of the [facility]. To find out your arrival time, please call [phone] between 1PM - 3PM on: June 01, 2020 TUESDAY  REMEMBER: Instructions that are not followed completely may result in serious medical risk, up to and including death; or upon the discretion of your surgeon and anesthesiologist your surgery may need to be rescheduled.  Do not eat food after midnight the night before surgery.  No gum chewing, lozengers or hard candies.  You may however, drink CLEAR liquids up to 2 hours before you are scheduled to arrive for your surgery. Do not drink anything within 2 hours of your scheduled arrival time.  Clear liquids include: - water  - apple juice without pulp - gatorade (not RED, PURPLE, OR BLUE) - black coffee or tea (Do NOT add milk or creamers to the coffee or tea) Do NOT drink anything that is not on this list.  Type 1 and Type 2 diabetics should only drink water.    TAKE THESE MEDICATIONS THE MORNING OF SURGERY WITH A SIP OF WATER: LEVOTHYROXINE  CONTINUE ASPIRIN -NOT DAY OF SURGERY  One week prior to surgery: Stop Anti-inflammatories (NSAIDS) such as Advil, Aleve, Ibuprofen, Motrin, Naproxen, Naprosyn and Aspirin based products such as Excedrin, Goodys Powder, BC Powder.   USE TYLENOL ONLY Stop ANY OVER THE COUNTER supplements until after surgery. (However, you may continue taking Vitamin D, Vitamin B, and multivitamin up until the day before surgery.)  No Alcohol for 24 hours before or after surgery.  No Smoking including e-cigarettes for 24 hours prior to surgery.  No chewable tobacco products for at least 6 hours prior to surgery.  No nicotine patches on the day of surgery.  Do not use any "recreational" drugs for at least a week prior to your surgery.  Please be advised that the combination of cocaine and anesthesia may have negative outcomes,  up to and including death. If you test positive for cocaine, your surgery will be cancelled.  On the morning of surgery brush your teeth with toothpaste and water, you may rinse your mouth with mouthwash if you wish. Do not swallow any toothpaste or mouthwash.  Do not wear jewelry, make-up, hairpins, clips or nail polish.  Do not wear lotions, powders, or perfumes.   Do not shave body from the neck down 48 hours prior to surgery just in case you cut yourself which could leave a site for infection.  Also, freshly shaved skin may become irritated if using the CHG soap.  Contact lenses, hearing aids and dentures may not be worn into surgery.  Do not bring valuables to the hospital. [facility] is not responsible for any missing/lost belongings or valuables.   Use CHG Soap as directed on instruction sheet.  Notify your doctor if there is any change in your medical condition (cold, fever, infection).  Wear comfortable clothing (specific to your surgery type) to the hospital.  Plan for stool softeners for home use; pain medications have a tendency to cause constipation. You can also help prevent constipation by eating foods high in fiber such as fruits and vegetables and drinking plenty of fluids as your diet allows.  After surgery, you can help prevent lung complications by doing breathing exercises.  Take deep breaths and cough every 1-2 hours. Your doctor may order a device called an Incentive Spirometer to help you take deep breaths. When coughing or  sneezing, hold a pillow firmly against your incision with both hands. This is called "splinting." Doing this helps protect your incision. It also decreases belly discomfort.  If you are being discharged the day of surgery, you will not be allowed to drive home. You will need a responsible adult (18 years or older) to drive you home and stay with you that night.   Please call the [place] Dept. at [phone] if you have  any questions about these instructions.  Visitation Policy:  Patients undergoing a surgery or procedure may have one family member or support person with them as long as that person is not COVID-19 positive or experiencing its symptoms.  That person may remain in the waiting area during the procedure.  Inpatient Visitation Update:   In an effort to ensure the safety of our team members and our patients, we are implementing a change to our visitation policy:  Effective Monday, Aug. 9, at 7 a.m., inpatients will be allowed one support person.  o The support person may change daily.  o The support person must pass our screening, gel in and out, and wear a mask at all times, including in the patient's room.  o Patients must also wear a mask when staff or their support person are in the room.  o Masking is required regardless of vaccination status.  Systemwide, no visitors 17 or younger.

## 2020-05-31 ENCOUNTER — Other Ambulatory Visit: Payer: Self-pay | Admitting: General Surgery

## 2020-05-31 ENCOUNTER — Other Ambulatory Visit: Payer: Self-pay

## 2020-05-31 ENCOUNTER — Encounter
Admission: RE | Admit: 2020-05-31 | Discharge: 2020-05-31 | Disposition: A | Discharge status: Home / Self Care | Payer: Medicare Other | Source: Ambulatory Visit | Attending: General Surgery | Admitting: General Surgery

## 2020-05-31 DIAGNOSIS — I440 Atrioventricular block, first degree: Secondary | ICD-10-CM | POA: Diagnosis not present

## 2020-05-31 DIAGNOSIS — Z20822 Contact with and (suspected) exposure to covid-19: Secondary | ICD-10-CM | POA: Diagnosis not present

## 2020-05-31 DIAGNOSIS — I10 Essential (primary) hypertension: Secondary | ICD-10-CM | POA: Diagnosis not present

## 2020-05-31 DIAGNOSIS — D0511 Intraductal carcinoma in situ of right breast: Secondary | ICD-10-CM

## 2020-05-31 DIAGNOSIS — Z01818 Encounter for other preprocedural examination: Secondary | ICD-10-CM | POA: Diagnosis not present

## 2020-05-31 LAB — POTASSIUM: Potassium: 3.5 mmol/L (ref 3.5–5.1)

## 2020-05-31 LAB — SARS CORONAVIRUS 2 (TAT 6-24 HRS): SARS Coronavirus 2: NEGATIVE

## 2020-06-02 ENCOUNTER — Encounter
Admission: RE | Disposition: A | Discharge status: Home / Self Care | Payer: Self-pay | Source: Home / Self Care | Attending: General Surgery

## 2020-06-02 ENCOUNTER — Ambulatory Visit: Payer: Medicare Other | Admitting: Certified Registered"

## 2020-06-02 ENCOUNTER — Ambulatory Visit
Admission: RE | Admit: 2020-06-02 | Discharge: 2020-06-02 | Disposition: A | Discharge status: Home / Self Care | Payer: Medicare Other | Attending: General Surgery | Admitting: General Surgery

## 2020-06-02 ENCOUNTER — Other Ambulatory Visit: Payer: Self-pay

## 2020-06-02 ENCOUNTER — Ambulatory Visit
Admission: RE | Admit: 2020-06-02 | Discharge: 2020-06-02 | Disposition: A | Discharge status: Home / Self Care | Payer: Medicare Other | Source: Ambulatory Visit | Attending: General Surgery | Admitting: General Surgery

## 2020-06-02 DIAGNOSIS — Z923 Personal history of irradiation: Secondary | ICD-10-CM | POA: Insufficient documentation

## 2020-06-02 DIAGNOSIS — N6011 Diffuse cystic mastopathy of right breast: Secondary | ICD-10-CM | POA: Insufficient documentation

## 2020-06-02 DIAGNOSIS — H3530 Unspecified macular degeneration: Secondary | ICD-10-CM | POA: Insufficient documentation

## 2020-06-02 DIAGNOSIS — Z853 Personal history of malignant neoplasm of breast: Secondary | ICD-10-CM | POA: Insufficient documentation

## 2020-06-02 DIAGNOSIS — Z170 Estrogen receptor positive status [ER+]: Secondary | ICD-10-CM | POA: Insufficient documentation

## 2020-06-02 DIAGNOSIS — I10 Essential (primary) hypertension: Secondary | ICD-10-CM | POA: Insufficient documentation

## 2020-06-02 DIAGNOSIS — Z79899 Other long term (current) drug therapy: Secondary | ICD-10-CM | POA: Insufficient documentation

## 2020-06-02 DIAGNOSIS — Z7982 Long term (current) use of aspirin: Secondary | ICD-10-CM | POA: Insufficient documentation

## 2020-06-02 DIAGNOSIS — Z85828 Personal history of other malignant neoplasm of skin: Secondary | ICD-10-CM | POA: Insufficient documentation

## 2020-06-02 DIAGNOSIS — D0511 Intraductal carcinoma in situ of right breast: Secondary | ICD-10-CM

## 2020-06-02 HISTORY — PX: BREAST LUMPECTOMY: SHX2

## 2020-06-02 SURGERY — BREAST LUMPECTOMY
Anesthesia: General | Laterality: Right

## 2020-06-02 MED ORDER — CHLORHEXIDINE GLUCONATE 0.12 % MT SOLN
15.0000 mL | Freq: Once | OROMUCOSAL | Status: AC
  Administered 2020-06-02: 09:00:00 15 mL via OROMUCOSAL

## 2020-06-02 MED ORDER — BUPIVACAINE HCL (PF) 0.5 % IJ SOLN
INTRAMUSCULAR | Status: AC
  Filled 2020-06-02: qty 60

## 2020-06-02 MED ORDER — FENTANYL CITRATE (PF) 100 MCG/2ML IJ SOLN
INTRAMUSCULAR | Status: DC | PRN
  Administered 2020-06-02 (×4): 25 ug via INTRAVENOUS

## 2020-06-02 MED ORDER — ACETAMINOPHEN 10 MG/ML IV SOLN
INTRAVENOUS | Status: DC | PRN
  Administered 2020-06-02: 1000 mg via INTRAVENOUS

## 2020-06-02 MED ORDER — FAMOTIDINE 20 MG PO TABS
ORAL_TABLET | ORAL | Status: AC
  Filled 2020-06-02: qty 1

## 2020-06-02 MED ORDER — ACETAMINOPHEN 10 MG/ML IV SOLN
INTRAVENOUS | Status: AC
  Filled 2020-06-02: qty 100

## 2020-06-02 MED ORDER — CHLORHEXIDINE GLUCONATE 0.12 % MT SOLN
OROMUCOSAL | Status: AC
  Filled 2020-06-02: qty 15

## 2020-06-02 MED ORDER — BUPIVACAINE-EPINEPHRINE (PF) 0.5% -1:200000 IJ SOLN
INTRAMUSCULAR | Status: DC | PRN
  Administered 2020-06-02: 10 mL
  Administered 2020-06-02: 20 mL

## 2020-06-02 MED ORDER — FENTANYL CITRATE (PF) 100 MCG/2ML IJ SOLN: 25.0000 ug | INTRAMUSCULAR | Status: DC | PRN

## 2020-06-02 MED ORDER — BUPIVACAINE-EPINEPHRINE (PF) 0.5% -1:200000 IJ SOLN
INTRAMUSCULAR | Status: AC
  Filled 2020-06-02: qty 60

## 2020-06-02 MED ORDER — PHENYLEPHRINE HCL (PRESSORS) 10 MG/ML IV SOLN
INTRAVENOUS | Status: DC | PRN
  Administered 2020-06-02 (×3): 100 ug via INTRAVENOUS

## 2020-06-02 MED ORDER — DEXAMETHASONE SODIUM PHOSPHATE 10 MG/ML IJ SOLN
INTRAMUSCULAR | Status: DC | PRN
  Administered 2020-06-02: 10 mg via INTRAVENOUS

## 2020-06-02 MED ORDER — ONDANSETRON HCL 4 MG/2ML IJ SOLN: 4.0000 mg | Freq: Once | INTRAMUSCULAR | Status: DC | PRN

## 2020-06-02 MED ORDER — PROPOFOL 10 MG/ML IV BOLUS
INTRAVENOUS | Status: DC | PRN
  Administered 2020-06-02: 160 mg via INTRAVENOUS

## 2020-06-02 MED ORDER — ONDANSETRON HCL 4 MG/2ML IJ SOLN
INTRAMUSCULAR | Status: DC | PRN
  Administered 2020-06-02 (×2): 4 mg via INTRAVENOUS

## 2020-06-02 MED ORDER — MEPERIDINE HCL 50 MG/ML IJ SOLN: 6.2500 mg | INTRAMUSCULAR | Status: DC | PRN

## 2020-06-02 MED ORDER — HYDROCODONE-ACETAMINOPHEN 5-325 MG PO TABS: 1.0000 | ORAL_TABLET | ORAL | 0 refills | Status: AC | PRN

## 2020-06-02 MED ORDER — DEXMEDETOMIDINE (PRECEDEX) IN NS 20 MCG/5ML (4 MCG/ML) IV SYRINGE
PREFILLED_SYRINGE | INTRAVENOUS | Status: DC | PRN
  Administered 2020-06-02: 8 ug via INTRAVENOUS

## 2020-06-02 MED ORDER — ORAL CARE MOUTH RINSE: 15.0000 mL | Freq: Once | OROMUCOSAL | Status: AC

## 2020-06-02 MED ORDER — LIDOCAINE HCL (CARDIAC) PF 100 MG/5ML IV SOSY
PREFILLED_SYRINGE | INTRAVENOUS | Status: DC | PRN
  Administered 2020-06-02: 80 mg via INTRAVENOUS

## 2020-06-02 MED ORDER — FENTANYL CITRATE (PF) 100 MCG/2ML IJ SOLN
INTRAMUSCULAR | Status: AC
  Filled 2020-06-02: qty 2

## 2020-06-02 MED ORDER — EPHEDRINE SULFATE 50 MG/ML IJ SOLN
INTRAMUSCULAR | Status: DC | PRN
  Administered 2020-06-02: 5 mg via INTRAVENOUS

## 2020-06-02 MED ORDER — FAMOTIDINE 20 MG PO TABS
20.0000 mg | ORAL_TABLET | Freq: Once | ORAL | Status: AC
  Administered 2020-06-02: 09:00:00 20 mg via ORAL

## 2020-06-02 MED ORDER — GLYCOPYRROLATE 0.2 MG/ML IJ SOLN
INTRAMUSCULAR | Status: DC | PRN
  Administered 2020-06-02: .2 mg via INTRAVENOUS

## 2020-06-02 MED ORDER — LACTATED RINGERS IV SOLN: INTRAVENOUS | Status: DC

## 2020-06-02 SURGICAL SUPPLY — 57 items
APL PRP STRL LF DISP 70% ISPRP (MISCELLANEOUS) ×1
APL SKNCLS STERI-STRIP NONHPOA (GAUZE/BANDAGES/DRESSINGS) ×1
BENZOIN TINCTURE PRP APPL 2/3 (GAUZE/BANDAGES/DRESSINGS) ×2 IMPLANT
BINDER BREAST LRG (GAUZE/BANDAGES/DRESSINGS) IMPLANT
BINDER BREAST MEDIUM (GAUZE/BANDAGES/DRESSINGS) IMPLANT
BINDER BREAST XLRG (GAUZE/BANDAGES/DRESSINGS) ×2 IMPLANT
BINDER BREAST XXLRG (GAUZE/BANDAGES/DRESSINGS) IMPLANT
BLADE PHOTON ILLUMINATED (MISCELLANEOUS) IMPLANT
BLADE SURG 15 STRL SS SAFETY (BLADE) ×6 IMPLANT
BULB RESERV EVAC DRAIN JP 100C (MISCELLANEOUS) IMPLANT
CHLORAPREP W/TINT 26 (MISCELLANEOUS) ×3 IMPLANT
CLOSURE WOUND 1/2 X4 (GAUZE/BANDAGES/DRESSINGS) ×1
CNTNR SPEC 2.5X3XGRAD LEK (MISCELLANEOUS)
CONT SPEC 4OZ STER OR WHT (MISCELLANEOUS)
CONT SPEC 4OZ STRL OR WHT (MISCELLANEOUS)
CONTAINER SPEC 2.5X3XGRAD LEK (MISCELLANEOUS) IMPLANT
COVER PROBE FLX POLY STRL (MISCELLANEOUS) ×3 IMPLANT
COVER WAND RF STERILE (DRAPES) ×3 IMPLANT
DEVICE DUBIN SPECIMEN MAMMOGRA (MISCELLANEOUS) ×3 IMPLANT
DRAIN CHANNEL JP 15F RND 16 (MISCELLANEOUS) IMPLANT
DRAPE LAPAROTOMY TRNSV 106X77 (MISCELLANEOUS) ×3 IMPLANT
DRSG GAUZE FLUFF 36X18 (GAUZE/BANDAGES/DRESSINGS) ×3 IMPLANT
DRSG TELFA 3X8 NADH (GAUZE/BANDAGES/DRESSINGS) ×3 IMPLANT
ELECT CAUTERY BLADE TIP 2.5 (TIP) ×3
ELECT REM PT RETURN 9FT ADLT (ELECTROSURGICAL) ×3
ELECTRODE CAUTERY BLDE TIP 2.5 (TIP) ×1 IMPLANT
ELECTRODE REM PT RTRN 9FT ADLT (ELECTROSURGICAL) ×1 IMPLANT
GLOVE BIO SURGEON STRL SZ7.5 (GLOVE) ×5 IMPLANT
GLOVE INDICATOR 8.0 STRL GRN (GLOVE) ×5 IMPLANT
GOWN STRL REUS W/ TWL LRG LVL3 (GOWN DISPOSABLE) ×2 IMPLANT
GOWN STRL REUS W/TWL LRG LVL3 (GOWN DISPOSABLE) ×6
KIT TURNOVER KIT A (KITS) ×3 IMPLANT
LABEL OR SOLS (LABEL) ×3 IMPLANT
MANIFOLD NEPTUNE II (INSTRUMENTS) ×3 IMPLANT
MARGIN MAP 10MM (MISCELLANEOUS) ×3 IMPLANT
NDL HYPO 25X1 1.5 SAFETY (NEEDLE) ×2 IMPLANT
NEEDLE HYPO 22GX1.5 SAFETY (NEEDLE) ×3 IMPLANT
NEEDLE HYPO 25X1 1.5 SAFETY (NEEDLE) ×6 IMPLANT
PACK BASIN MINOR ARMC (MISCELLANEOUS) ×3 IMPLANT
PAD DRESSING TELFA 3X8 NADH (GAUZE/BANDAGES/DRESSINGS) ×1 IMPLANT
SHEARS FOC LG CVD HARMONIC 17C (MISCELLANEOUS) IMPLANT
SHEARS HARMONIC 9CM CVD (BLADE) IMPLANT
STRIP CLOSURE SKIN 1/2X4 (GAUZE/BANDAGES/DRESSINGS) ×2 IMPLANT
SUT ETHILON 3-0 FS-10 30 BLK (SUTURE) ×3
SUT SILK 2 0 (SUTURE) ×3
SUT SILK 2-0 18XBRD TIE 12 (SUTURE) ×1 IMPLANT
SUT VIC AB 2-0 CT1 27 (SUTURE) ×6
SUT VIC AB 2-0 CT1 TAPERPNT 27 (SUTURE) ×2 IMPLANT
SUT VIC AB 4-0 FS2 27 (SUTURE) ×3 IMPLANT
SUT VICRYL+ 3-0 144IN (SUTURE) IMPLANT
SUTURE EHLN 3-0 FS-10 30 BLK (SUTURE) IMPLANT
SWABSTK COMLB BENZOIN TINCTURE (MISCELLANEOUS) ×3 IMPLANT
SYR 10ML LL (SYRINGE) ×3 IMPLANT
SYR BULB IRRIG 60ML STRL (SYRINGE) ×3 IMPLANT
SYR CONTROL 10ML LL (SYRINGE) ×3 IMPLANT
TAPE TRANSPORE STRL 2 31045 (GAUZE/BANDAGES/DRESSINGS) IMPLANT
WATER STERILE IRR 1000ML POUR (IV SOLUTION) ×3 IMPLANT

## 2020-06-02 NOTE — Anesthesia Procedure Notes (Signed)
Procedure Name: LMA Insertion Performed by: Fletcher-Harrison, Kalimah Capurro, CRNA Pre-anesthesia Checklist: Patient identified, Emergency Drugs available, Suction available and Patient being monitored Patient Re-evaluated:Patient Re-evaluated prior to induction Oxygen Delivery Method: Circle system utilized Preoxygenation: Pre-oxygenation with 100% oxygen Induction Type: IV induction Ventilation: Mask ventilation without difficulty LMA: LMA inserted LMA Size: 4.0 Number of attempts: 1 Placement Confirmation: positive ETCO2,  CO2 detector and breath sounds checked- equal and bilateral Tube secured with: Tape Dental Injury: Teeth and Oropharynx as per pre-operative assessment        

## 2020-06-02 NOTE — Transfer of Care (Signed)
Immediate Anesthesia Transfer of Care Note  Patient: Crystal Blanchard  Procedure(s) Performed: BREAST LUMPECTOMY (Right )  Patient Location: PACU  Anesthesia Type:General  Level of Consciousness: awake, drowsy and patient cooperative  Airway & Oxygen Therapy: Patient Spontanous Breathing and Patient connected to face mask oxygen  Post-op Assessment: Report given to RN and Post -op Vital signs reviewed and stable  Post vital signs: Reviewed and stable  Last Vitals:  Vitals Value Taken Time  BP 145/59 06/02/20 1035  Temp 36.6 C 06/02/20 1035  Pulse 83 06/02/20 1044  Resp 12 06/02/20 1044  SpO2 94 % 06/02/20 1044  Vitals shown include unvalidated device data.  Last Pain:  Vitals:   06/02/20 1035  TempSrc:   PainSc: Asleep         Complications: No complications documented.

## 2020-06-02 NOTE — H&P (Signed)
Crystal Blanchard [id number] [birth year]     HPI:  86 y/o with new foci of DCIS. For wide excision.   Medications Prior to Admission  Medication Sig Dispense Refill Last Dose  . benazepril (LOTENSIN) 40 MG tablet Take 40 mg by mouth every morning.    06/01/2020 at Unknown time  . Cholecalciferol (VITAMIN D3) 2000 UNITS TABS Take 2,000 Units by mouth daily.    06/01/2020 at Unknown time  . doxazosin (CARDURA) 8 MG tablet Take 8 mg by mouth at bedtime.    06/01/2020 at Unknown time  . furosemide (LASIX) 20 MG tablet Take 20 mg by mouth daily.   06/01/2020 at Unknown time  . levothyroxine (SYNTHROID, LEVOTHROID) 75 MCG tablet Take 75 mcg by mouth daily before breakfast.   06/02/2020 at 0700  . Multiple Vitamins-Minerals (PRESERVISION AREDS 2 PO) Take 1 capsule by mouth 2 (two) times daily.    06/01/2020 at Unknown time  . aspirin 81 MG tablet Take 81 mg by mouth daily.   05/31/2020  . HYDROcodone-acetaminophen (NORCO) 5-325 MG tablet Take 1-2 tablets by mouth every 4 (four) hours as needed for moderate pain. (Patient not taking: Reported on 03/01/2016) 30 tablet 0    No Known Allergies Past Medical History:  Diagnosis Date  . Arthritis   . Breast cancer [facility]) 2011   Wide excision, radiation therapy, tamoxifen. Intermediate grade DCIS.  Marland Kitchen Elevated blood sugar   . Hyperlipidemia   . Hypertension 1985  . Hypothyroidism   . Macular degeneration 2013   white macular degeneration-RECEIVES EYE INJECTIONS EVERY 5 WEEKS  . Malignant neoplasm of upper-outer quadrant of female breast [facility]) January 18, 2010   DCIS, 6 mm.core biopsy dated December 28, 2009 showed florid ductal hyperplasia with focal atypia. ER 90%, PR 90%..  . Personal history of radiation therapy   . Skin cancer   . Thyroid disease 1996  . Vertigo    Past Surgical History:  Procedure Laterality Date  . ABDOMINAL HYSTERECTOMY  1968  . BREAST BIOPSY Right 12/2009   Wide excision DCIS., Mammosite placement August 2011.  Marland Kitchen BREAST BIOPSY  Right 04/27/2020   Stereo bx, coil clip, path pending  . BREAST LUMPECTOMY Right 2011   DCIS  . CATARACT EXTRACTION    . CHOLECYSTECTOMY N/A 05/28/2015   Procedure: LAPAROSCOPIC CHOLECYSTECTOMY WITH INTRAOPERATIVE CHOLANGIOGRAM;  Surgeon: Robert Bellow, MD;  Location: [facility] ORS;  Service: General;  Laterality: N/A;  . COLONOSCOPY  2011   Dr Vira Agar [facility]  . EYE SURGERY Left 2009  . EYE SURGERY Right 2010  . PERICARDIAL WINDOW     Social History   Socioeconomic History  . Marital status: Widowed    Spouse name: Not on file  . Number of children: Not on file  . Years of education: Not on file  . Highest education level: Not on file  Occupational History  . Not on file  Tobacco Use  . Smoking status: Never Smoker  . Smokeless tobacco: Never Used  Vaping Use  . Vaping Use: Never used  Substance and Sexual Activity  . Alcohol use: No  . Drug use: No  . Sexual activity: Not on file  Other Topics Concern  . Not on file  Social History Narrative  . Not on file   Social Determinants of Health   Financial Resource Strain: Not on file  Food Insecurity: Not on file  Transportation Needs: Not on file  Physical Activity: Not on file  Stress: Not on file  Social Connections: Not on file  Intimate Partner Violence: Not on file   Social History   Social History Narrative  . Not on file     ROS: Negative.     PE: HEENT: Negative. Lungs: Clear. Cardio: RR.  Assessment/Plan:  Proceed with planned right breast wide excision.    Forest Gleason [facility] 06/02/2020

## 2020-06-02 NOTE — Anesthesia Postprocedure Evaluation (Signed)
Anesthesia Post Note  Patient: Crystal Blanchard  Procedure(s) Performed: BREAST LUMPECTOMY (Right )  Patient location during evaluation: PACU Anesthesia Type: General Level of consciousness: awake and alert, awake and oriented Pain management: pain level controlled Vital Signs Assessment: post-procedure vital signs reviewed and stable Respiratory status: spontaneous breathing, nonlabored ventilation and respiratory function stable Cardiovascular status: blood pressure returned to baseline Postop Assessment: no apparent nausea or vomiting Anesthetic complications: no   No complications documented.   Last Vitals:  Vitals:   06/02/20 1201 06/02/20 1225  BP: (!) 181/79 (!) 175/78  Pulse: 68 70  Resp: 18 14  Temp: (!) 36.3 C (!) 36.3 C  SpO2: (!) 88% 92%    Last Pain:  Vitals:   06/02/20 1225  TempSrc:   PainSc: 0-No pain                 Phill Mutter

## 2020-06-02 NOTE — Progress Notes (Addendum)
   06/02/20 0932  Clinical Encounter Type  Visited With Family  Visit Type Initial  Referral From Chaplain  Consult/Referral To Chaplain  While rounding [facility] waiting area, chaplain briefly visited with Pt's daughter. She said she had received  text from staff. Pt's daughter did not realize that she could use that number to keep track of where her mother was in the procedure. Chaplain wish her a Merry Christmas and left.

## 2020-06-02 NOTE — Progress Notes (Signed)
Dr. Nira Retort at bedside. States that because pt recovers back in to 95% on her own quickly that she is ok to go home.

## 2020-06-02 NOTE — Op Note (Signed)
Preoperative diagnosis: DCIS involving the right medial breast.  Postoperative diagnosis: Same.  Operative procedure: Right breast wide excision with ultrasound guidance.  Operating surgeon: Hervey Ard, MD  Anesthesia: General by LMA, Marcaine 0.5% with 1: 200,000 units of epinephrine, 30 cc.  Estimated blood loss: 5 cc.  Clinical note: This 86 year old woman had previously undergone treatment for an upper outer quadrant breast lesion treated by wide excision and accelerated partial breast radiation.  Mammogram showed a new area of DCIS in the medial breast and she desired breast conservation.  Operative note: The patient underwent general anesthesia and tolerated this well.  Local anesthesia was infiltrated for postoperative comfort.  Ultrasound was used to confirm the biopsy site.  Image was placed in the chart for permanent record.  A curvilinear incision from the 1 to 3 o'clock position was made about 5 cm from the nipple.  The skin was incised sharply and the subcutaneous tissue divided with electrocautery.  Hemostasis was electrocautery.  Beginning about 1 cm below the skin surface flaps were elevated circumferentially and a 2-1/2 x 2-1/2 x 3 cm block of tissue including the pectoralis fascia was excised orientated and specimen radiograph obtained.  This showed the clip close to the superior margin.  This margin was reexcised with an additional 5 mm of tissue removed, the new superior margin placed on a Telfa pad and orientated with metallic clips.  This was sent in formalin for routine histology.  The breast was elevated circumferentially off the underlying pectoralis muscle.  This was then approximated with interrupted 2-0 Vicryl figure-of-eight sutures.  The superficial breast tissue was approximated in similar fashion.  The skin flaps were elevated for about 3 cm to provide smooth apposition.  The skin was closed with a running 4-0 Vicryl subcuticular suture.  Benzoin and Steri-Strips  followed by Telfa, fluff gauze and a compressive wrap were applied.  The patient tolerated the procedure well and was taken to recovery in stable condition.

## 2020-06-02 NOTE — Discharge Instructions (Signed)
AMBULATORY SURGERY  DISCHARGE INSTRUCTIONS   1) The drugs that you were given will stay in your system until tomorrow so for the next 24 hours you should not:  A) Drive an automobile B) Make any legal decisions C) Drink any alcoholic beverage   2) You may resume regular meals tomorrow.  Today it is better to start with liquids and gradually work up to solid foods.  You may eat anything you prefer, but it is better to start with liquids, then soup and crackers, and gradually work up to solid foods.   3) Please notify your doctor immediately if you have any unusual bleeding, trouble breathing, redness and pain at the surgery site, drainage, fever, or pain not relieved by medication.    4) Additional Instructions:    Please contact your physician with any problems or Same Day Surgery at [phone], Monday through Friday 6 am to 4 pm, or [facility] number at [phone]. Lumpectomy, Care After This sheet gives you information about how to care for yourself after your procedure. Your health care provider may also give you more specific instructions. If you have problems or questions, contact your health care provider. What can I expect after the procedure? After the procedure, it is common to have:  Breast swelling.  Breast tenderness.  Stiffness in your arm or shoulder.  A change in the shape and feel of your breast.  Scar tissue that feels hard to the touch in the area where the lump was removed. Follow these instructions at home: Medicines  Take over-the-counter and prescription medicines only as told by your health care provider.  If you were prescribed an antibiotic medicine, take it as told by your health care provider. Do not stop taking the antibiotic even if you start to feel better.  Ask your health care provider if the medicine prescribed to you: ? Requires you to avoid driving or using heavy machinery. ? Can cause constipation. You may need  to take these actions to prevent or treat constipation:  Drink enough fluid to keep your urine pale yellow.  Take over-the-counter or prescription medicines.  Eat foods that are high in fiber, such as beans, whole grains, and fresh fruits and vegetables.  Limit foods that are high in fat and processed sugars, such as fried or sweet foods. Incision care      Follow instructions from your health care provider about how to take care of your incision. Make sure you: ? Wash your hands with soap and water before and after you change your bandage (dressing). If soap and water are not available, use hand sanitizer. ? Change your dressing as told by your health care provider. ? Leave stitches (sutures), skin glue, or adhesive strips in place. These skin closures may need to stay in place for 2 weeks or longer. If adhesive strip edges start to loosen and curl up, you may trim the loose edges. Do not remove adhesive strips completely unless your health care provider tells you to do that.  Check your incision area every day for signs of infection. Check for: ? More redness, swelling, or pain. ? Fluid or blood. ? Warmth. ? Pus or a bad smell.  Keep your dressing clean and dry.  If you were sent home with a surgical drain in place, follow instructions from your health care provider about emptying it. Bathing  Do not take baths, swim, or use a hot tub until your health care provider approves.  Ask   your health care provider if you may take showers. You may only be allowed to take sponge baths. Activity  Rest as told by your health care provider.  Avoid sitting for a long time without moving. Get up to take short walks every 1-2 hours. This is important to improve blood flow and breathing. Ask for help if you feel weak or unsteady.  Return to your normal activities as told by your health care provider. Ask your health care provider what activities are safe for you.  Be careful to avoid any  activities that could cause an injury to your arm on the side of your surgery.  Do not lift anything that is heavier than 10 lb (4.5 kg), or the limit that you are told, until your health care provider says that it is safe. Avoid lifting with the arm that is on the side of your surgery.  Do not carry heavy objects on your shoulder on the side of your surgery.  Do exercises to keep your shoulder and arm from getting stiff and swollen. Talk with your health care provider about which exercises are safe for you. General instructions  Wear a supportive bra as told by your health care provider.  Raise (elevate) your arm above the level of your heart while you are sitting or lying down.  Do not wear tight jewelry on your arm, wrist, or fingers on the side of your surgery.  Keep all follow-up visits as told by your health care provider. This is important. ? You may need to be screened for extra fluid around the lymph nodes and swelling in the breast and arm (lymphedema). Follow instructions from your health care provider about how often you should be checked.  If you had any lymph nodes removed during your procedure, be sure to tell all of your health care providers. This is important information to share before you are involved in certain procedures, such as having blood tests or having your blood pressure taken. Contact a health care provider if:  You develop a rash.  You have a fever.  Your pain medicine is not working.  You have swelling, weakness, or numbness in your arm that does not improve after a few weeks.  You have new swelling in your breast.  You have any of these signs of infection: ? More redness, swelling, or pain in your incision area. ? Fluid or blood coming from your incision. ? Warmth coming from the incision area. ? Pus or a bad smell coming from your incision. Get help right away if you have:  Very bad pain in your breast or arm.  Swelling in your legs or  arms.  Redness, warmth, or pain in your leg or arm.  Chest pain.  Difficulty breathing. Summary  After the procedure, it is common to have breast tenderness, swelling in your breast, and stiffness in your arm and shoulder.  Follow instructions from your health care provider about how to take care of your incision.  Do not lift anything that is heavier than 10 lb (4.5 kg), or the limit that you are told, until your health care provider says that it is safe. Avoid lifting with the arm that is on the side of your surgery.  If you had any lymph nodes removed during your procedure, be sure to tell all of your health care providers. This is important information to share before you are involved in certain procedures, such as having blood tests or having your blood pressure   taken. This information is not intended to replace advice given to you by your health care provider. Make sure you discuss any questions you have with your health care provider. Document Revised: 12/02/2018 Document Reviewed: 12/02/2018 Elsevier Patient Education  2020 [employer]  

## 2020-06-02 NOTE — Anesthesia Preprocedure Evaluation (Addendum)
Anesthesia Evaluation  Patient identified by MRN, date of birth, ID band Patient awake    Reviewed: Allergy & Precautions, NPO status , Patient's Chart, lab work & pertinent test results  Airway Mallampati: III  TM Distance: >3 FB Neck ROM: Limited    Dental  (+) Teeth Intact, Missing   Pulmonary neg pulmonary ROS,    Pulmonary exam normal        Cardiovascular Exercise Tolerance: Good hypertension, Pt. on medications Normal cardiovascular exam     Neuro/Psych negative neurological ROS  negative psych ROS   GI/Hepatic negative GI ROS, Neg liver ROS,   Endo/Other  Hypothyroidism Treated.  Renal/GU negative Renal ROS  negative genitourinary   Musculoskeletal  (+) Arthritis , Osteoarthritis,    Abdominal (+)  Abdomen: soft.    Peds  Hematology   Anesthesia Other Findings   Reproductive/Obstetrics                            Anesthesia Physical  Anesthesia Plan  ASA: II  Anesthesia Plan: General   Post-op Pain Management:    Induction: Intravenous  PONV Risk Score and Plan: Ondansetron, Dexamethasone and Midazolam  Airway Management Planned: LMA  Additional Equipment:   Intra-op Plan:   Post-operative Plan: Extubation in OR  Informed Consent: I have reviewed the patients History and Physical, chart, labs and discussed the procedure including the risks, benefits and alternatives for the proposed anesthesia with the patient or authorized representative who has indicated his/her understanding and acceptance.       Plan Discussed with: CRNA  Anesthesia Plan Comments:        Anesthesia Quick Evaluation

## 2020-06-02 NOTE — Progress Notes (Signed)
Pt O2 sats 86-93% on room air. Pt gotten off of stretcher and sat in a chair. Incentive spirometer used. Pt O2 sats continue to drop down to 87% with recovery with in a few seconds up to 93%. Dr. Nira Retort. Acknowledged. Stated he would come over to see her. No new orders at this time.

## 2020-06-03 ENCOUNTER — Encounter: Payer: Self-pay | Admitting: General Surgery

## 2020-06-03 LAB — SURGICAL PATHOLOGY

## 2020-06-17 ENCOUNTER — Other Ambulatory Visit: Payer: Self-pay | Admitting: General Surgery

## 2020-06-17 DIAGNOSIS — Z170 Estrogen receptor positive status [ER+]: Secondary | ICD-10-CM

## 2020-06-17 DIAGNOSIS — C50211 Malignant neoplasm of upper-inner quadrant of right female breast: Secondary | ICD-10-CM

## 2020-07-05 ENCOUNTER — Ambulatory Visit
Admission: RE | Admit: 2020-07-05 | Discharge: 2020-07-05 | Disposition: A | Discharge status: Home / Self Care | Payer: Medicare Other | Source: Ambulatory Visit | Attending: General Surgery | Admitting: General Surgery

## 2020-07-05 ENCOUNTER — Other Ambulatory Visit: Payer: Self-pay

## 2020-07-05 DIAGNOSIS — Z780 Asymptomatic menopausal state: Secondary | ICD-10-CM | POA: Insufficient documentation

## 2020-07-05 DIAGNOSIS — C50211 Malignant neoplasm of upper-inner quadrant of right female breast: Secondary | ICD-10-CM

## 2020-07-05 DIAGNOSIS — M8589 Other specified disorders of bone density and structure, multiple sites: Secondary | ICD-10-CM | POA: Diagnosis not present

## 2020-07-05 DIAGNOSIS — Z853 Personal history of malignant neoplasm of breast: Secondary | ICD-10-CM | POA: Insufficient documentation

## 2020-07-05 DIAGNOSIS — Z170 Estrogen receptor positive status [ER+]: Secondary | ICD-10-CM | POA: Insufficient documentation

## 2020-07-05 DIAGNOSIS — Z13820 Encounter for screening for osteoporosis: Secondary | ICD-10-CM | POA: Insufficient documentation

## 2020-08-09 ENCOUNTER — Encounter (INDEPENDENT_AMBULATORY_CARE_PROVIDER_SITE_OTHER): Payer: Self-pay | Admitting: Ophthalmology

## 2020-08-09 ENCOUNTER — Other Ambulatory Visit: Payer: Self-pay

## 2020-08-09 ENCOUNTER — Ambulatory Visit (INDEPENDENT_AMBULATORY_CARE_PROVIDER_SITE_OTHER): Payer: Medicare Other | Admitting: Ophthalmology

## 2020-08-09 DIAGNOSIS — H353212 Exudative age-related macular degeneration, right eye, with inactive choroidal neovascularization: Secondary | ICD-10-CM

## 2020-08-09 DIAGNOSIS — H35721 Serous detachment of retinal pigment epithelium, right eye: Secondary | ICD-10-CM | POA: Diagnosis not present

## 2020-08-09 DIAGNOSIS — H43813 Vitreous degeneration, bilateral: Secondary | ICD-10-CM

## 2020-08-09 DIAGNOSIS — H35722 Serous detachment of retinal pigment epithelium, left eye: Secondary | ICD-10-CM | POA: Insufficient documentation

## 2020-08-09 DIAGNOSIS — H353211 Exudative age-related macular degeneration, right eye, with active choroidal neovascularization: Secondary | ICD-10-CM

## 2020-08-09 MED ORDER — FLUORESCEIN SODIUM 10 % IV SOLN
500.0000 mg | INTRAVENOUS | Status: AC | PRN
  Administered 2020-08-09: 500 mg via INTRAVENOUS

## 2020-08-09 NOTE — Assessment & Plan Note (Signed)
Present inferotemporal to the fovea, concern for CNVM, none detected by fluorescein angiography today we will continue to monitor closely

## 2020-08-09 NOTE — Assessment & Plan Note (Signed)

## 2020-08-09 NOTE — Progress Notes (Signed)
08/09/2020     CHIEF COMPLAINT Patient presents for Retina Follow Up (4 Mo F/U OU//Pt denies noticeable changes to [place] OU since last visit. Pt denies ocular pain, flashes of light, or floaters OU. //)   HISTORY OF PRESENT ILLNESS: Crystal Blanchard is a 87 y.o. female who presents to the clinic today for:   HPI    Retina Follow Up    Patient presents with  Wet AMD.  In right eye.  This started 4 months ago.  Severity is mild.  Duration of 4 months.  Since onset it is stable. Additional comments: 4 Mo F/U OU  Pt denies noticeable changes to [place] OU since last visit. Pt denies ocular pain, flashes of light, or floaters OU.          Last edited by Rockie Neighbours, [place] on 08/09/2020 10:40 AM. (History)      Referring physician: Albina Billet, MD [address]   [place],  [place] [id number]  HISTORICAL INFORMATION:   Selected notes from the MEDICAL RECORD NUMBER       CURRENT MEDICATIONS: No current outpatient medications on file. (Ophthalmic Drugs)   No current facility-administered medications for this visit. (Ophthalmic Drugs)   Current Outpatient Medications (Other)  Medication Sig  . aspirin 81 MG tablet Take 81 mg by mouth daily.  . benazepril (LOTENSIN) 40 MG tablet Take 40 mg by mouth every morning.   . Cholecalciferol (VITAMIN D3) 2000 UNITS TABS Take 2,000 Units by mouth daily.   Marland Kitchen doxazosin (CARDURA) 8 MG tablet Take 8 mg by mouth at bedtime.   . furosemide (LASIX) 20 MG tablet Take 20 mg by mouth daily.  Marland Kitchen HYDROcodone-acetaminophen (NORCO/VICODIN) 5-325 MG tablet Take 1 tablet by mouth every 4 (four) hours as needed for moderate pain.  Marland Kitchen levothyroxine (SYNTHROID, LEVOTHROID) 75 MCG tablet Take 75 mcg by mouth daily before breakfast.  . Multiple Vitamins-Minerals (PRESERVISION AREDS 2 PO) Take 1 capsule by mouth 2 (two) times daily.    No current facility-administered medications for this visit. (Other)      REVIEW OF SYSTEMS:    ALLERGIES No Known  Allergies  PAST MEDICAL HISTORY Past Medical History:  Diagnosis Date  . Arthritis   . Breast cancer [facility]) 2011   Wide excision, radiation therapy, tamoxifen. Intermediate grade DCIS.  Marland Kitchen Elevated blood sugar   . Hyperlipidemia   . Hypertension 1985  . Hypothyroidism   . Macular degeneration 2013   white macular degeneration-RECEIVES EYE INJECTIONS EVERY 5 WEEKS  . 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 ER 90%, PR 90%..  . Personal history of radiation therapy   . Skin cancer   . Thyroid disease 1996  . Vertigo    Past Surgical History:  Procedure Laterality Date  . ABDOMINAL HYSTERECTOMY  1968  . BREAST BIOPSY Right 12/2009   Wide excision DCIS., Mammosite placement August 2011.  Marland Kitchen BREAST BIOPSY Right 04/27/2020   Stereo bx, coil clip, path pending  . BREAST LUMPECTOMY Right 2011   DCIS  . BREAST LUMPECTOMY Right 06/02/2020   Procedure: BREAST LUMPECTOMY;  Surgeon: Robert Bellow, MD;  Location: [facility] ORS;  Service: General;  Laterality: Right;  . CATARACT EXTRACTION    . CHOLECYSTECTOMY N/A 05/28/2015   Procedure: LAPAROSCOPIC CHOLECYSTECTOMY WITH INTRAOPERATIVE CHOLANGIOGRAM;  Surgeon: Robert Bellow, MD;  Location: [facility] ORS;  Service: General;  Laterality: N/A;  . COLONOSCOPY  2011   Dr Vira Agar [facility]  . EYE SURGERY Left 2009  . EYE SURGERY Right 2010  . PERICARDIAL WINDOW      FAMILY HISTORY Family History  Problem Relation Age of Onset  . Heart failure Mother   . Cancer Father        bone  . Breast cancer Sister 91    SOCIAL HISTORY Social History   Tobacco Use  . Smoking status: Never Smoker  . Smokeless tobacco: Never Used  Vaping Use  . Vaping Use: Never used  Substance Use Topics  . Alcohol use: No  . Drug use: No         OPHTHALMIC EXAM:  Base Eye Exam    Visual Acuity (ETDRS)      Right Left    Dist [place] 20/200 20/80 -1   Dist ph [place] 20/50 -1 20/50       Tonometry (Tonopen, 10:40 AM)      Right Left   Pressure 17 17       Pupils      Pupils Dark Light Shape React APD   Right PERRL 3 3 Round Minimal None   Left PERRL 3 3 Round Minimal None       Visual Fields (Counting fingers)      Left Right    Full Full       Extraocular Movement      Right Left    Full Full       Neuro/Psych    Oriented x3: Yes   Mood/Affect: Normal       Dilation    Both eyes: 1.0% Mydriacyl, 2.5% Phenylephrine @ 10:46 AM        Slit Lamp and Fundus Exam    External Exam      Right Left   External Normal Normal       Slit Lamp Exam      Right Left   Lids/Lashes Normal Normal   Conjunctiva/Sclera White and quiet White and quiet   Cornea Clear Clear   Anterior Chamber Deep and quiet Deep and quiet   Iris Round and reactive Round and reactive   Lens Posterior chamber intraocular lens Posterior chamber intraocular lens   Anterior Vitreous Normal Normal       Fundus Exam      Right Left   Posterior Vitreous Normal Posterior vitreous detachment   Disc Normal Normal   C/D Ratio 0.2 0.15   Macula Hard drusen, Retinal pigment epithelial mottling, no macular thickening, Soft drusen Retinal pigment epithelial mottling, no macular thickening, Atrophy, Retinal pigment epithelial detachment   Vessels Normal Normal   Periphery Normal Normal          IMAGING AND PROCEDURES  Imaging and Procedures for 08/09/20  OCT, Retina - OU - Both Eyes       Right Eye Central Foveal Thickness: 234. Progression has been stable. Findings include abnormal foveal contour, retinal drusen , no IRF, no SRF, pigment epithelial detachment.   Left Eye Central Foveal Thickness: 252. Progression has been stable. Findings include abnormal foveal contour, retinal drusen , no IRF, pigment epithelial detachment.   Notes Pigment epithelial detachment superotemporal to the foveal region in the right  eye.  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 Mid/Late phase findings include pigment epithelial detachment, window defect. Choroidal neovascularization is not present.   Left Eye   Progression has no prior data. Early phase findings include pigment epithelial detachment, window defect, staining. Mid/Late phase findings include window defect, staining, pigment epithelial detachment. Choroidal neovascularization is not present.   Notes Subfoveal RPE window defects in the macula.  OD.  No signs of CNVM  OS no signs of CNVM in the region of pigment epithelial detachment inferotemporal to the macula       Color Fundus Photography Optos - OU - Both Eyes       Right Eye Progression has been stable. Disc findings include normal observations. Macula : geographic atrophy, retinal pigment epithelium abnormalities, drusen. Vessels : normal observations. Periphery : normal observations.   Left Eye Progression has been stable. Macula : drusen, retinal pigment epithelium abnormalities, geographic atrophy. Vessels : normal observations. Periphery : normal observations.   Notes No signs of CNVM, pigment epithelial detachment RPE changes are noted at macular region OU  Autofluorescence shows areas of extensive RPE atrophy in the macular para macular area OU                ASSESSMENT/PLAN:  Exudative age-related macular degeneration of right eye with inactive choroidal neovascularization (HCC) No active signs of CNVM OD today, watch pigment epithelial detachment superotemporal  Serous detachment of retinal pigment epithelium of right eye Superotemporal to the fovea  OD, no CNVM today  Exudative detachment of retinal pigment epithelium, left Present inferotemporal to the fovea, concern for CNVM, none detected by fluorescein angiography today we will continue to monitor closely  Posterior vitreous detachment of both eyes   The nature of posterior vitreous detachment was discussed with the patient as well as its physiology, its age prevalence, and its possible implication regarding retinal breaks and detachment.  An informational brochure was given to the patient.  All the patient's questions were answered.  The patient was asked to return if new or different flashes or floaters develops.   Patient was instructed to contact office immediately if any changes were noticed. I explained to the patient that vitreous inside the eye is similar to jello inside a bowl. As the jello melts it can start to pull away from the bowl, similarly the vitreous throughout our lives can begin to pull away from the retina. That process is called a posterior vitreous detachment. In some cases, the vitreous can tug hard enough on the retina to form a retinal tear. I discussed with the patient the signs and symptoms of a retinal detachment.  Do not rub the eye.      ICD-10-CM   1. Exudative age-related macular degeneration of right eye with active choroidal neovascularization (HCC)  H35.3211 OCT, Retina - OU - Both Eyes    Fluorescein Angiography Optos (Transit OS)    Color Fundus Photography Optos - OU - Both Eyes    Fluorescein Sodium 10 % injection 500 mg  2. Serous detachment of retinal pigment epithelium of right eye  H35.721 OCT, Retina - OU - Both Eyes    Fluorescein Angiography Optos (Transit OS)    Color Fundus Photography Optos - OU - Both Eyes    Fluorescein Sodium 10 % injection 500 mg  3. Exudative age-related macular degeneration of right  eye with inactive choroidal neovascularization ([facility])  H35.3212   4. Exudative detachment of retinal pigment epithelium, left  H35.722    5. Posterior vitreous detachment of both eyes  H43.813     1.  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  No signs of CNVM however on fundus fluorescein angiography left eye we will continue to observe  2.  3.  Ophthalmic Meds Ordered this visit:  Meds ordered this encounter  Medications  . Fluorescein Sodium 10 % injection 500 mg       Return in about 3 months (around 11/06/2020) for DILATE OU, OCT.  There are no Patient Instructions on file for this visit.   Explained the diagnoses, plan, and follow up with the patient and they expressed understanding.  Patient expressed understanding of the importance of proper follow up care.   Clent Demark Culley Hedeen M.D. Diseases & Surgery of the Retina and Vitreous Retina & Diabetic [place] 08/09/20     Abbreviations: M myopia (nearsighted); A astigmatism; H hyperopia (farsighted); P presbyopia; Mrx spectacle prescription;  CTL contact lenses; OD right eye; OS left eye; OU both eyes  XT exotropia; ET esotropia; PEK punctate epithelial keratitis; PEE punctate epithelial erosions; DES dry eye syndrome; MGD meibomian gland dysfunction; ATs artificial tears; PFAT's preservative free artificial tears; [place] nuclear sclerotic cataract; PSC posterior subcapsular cataract; ERM epi-retinal membrane; PVD posterior vitreous detachment; RD retinal detachment; DM diabetes mellitus; DR diabetic retinopathy; NPDR non-proliferative diabetic retinopathy; PDR proliferative diabetic retinopathy; CSME clinically significant macular edema; DME diabetic macular edema; dbh dot blot hemorrhages; CWS cotton wool spot; POAG primary open angle glaucoma; C/D cup-to-disc ratio; HVF humphrey visual field; GVF goldmann visual field; OCT optical coherence tomography; IOP intraocular pressure; BRVO Branch retinal vein occlusion; CRVO central retinal vein occlusion; CRAO central retinal artery  occlusion; BRAO branch retinal artery occlusion; RT retinal tear; SB scleral buckle; PPV pars plana vitrectomy; VH Vitreous hemorrhage; PRP panretinal laser photocoagulation; IVK intravitreal kenalog; VMT vitreomacular traction; MH Macular hole;  NVD neovascularization of the disc; NVE neovascularization elsewhere; AREDS age related eye disease study; ARMD age related macular degeneration; POAG primary open angle glaucoma; EBMD epithelial/anterior basement membrane dystrophy; ACIOL anterior chamber intraocular lens; IOL intraocular lens; PCIOL posterior chamber intraocular lens; Phaco/IOL phacoemulsification with intraocular lens placement; [place] photorefractive keratectomy; LASIK laser assisted in situ keratomileusis; HTN hypertension; DM diabetes mellitus; COPD chronic obstructive pulmonary disease

## 2020-08-09 NOTE — Assessment & Plan Note (Signed)
Superotemporal to the fovea OD, no CNVM today

## 2020-08-09 NOTE — Assessment & Plan Note (Signed)
No active signs of CNVM OD today, watch pigment epithelial detachment superotemporal

## 2020-10-01 ENCOUNTER — Ambulatory Visit: Payer: Self-pay | Admitting: *Deleted

## 2020-11-09 ENCOUNTER — Encounter (INDEPENDENT_AMBULATORY_CARE_PROVIDER_SITE_OTHER): Payer: Self-pay | Admitting: Ophthalmology

## 2020-11-09 ENCOUNTER — Other Ambulatory Visit: Payer: Self-pay

## 2020-11-09 ENCOUNTER — Ambulatory Visit (INDEPENDENT_AMBULATORY_CARE_PROVIDER_SITE_OTHER): Payer: Medicare Other | Admitting: Ophthalmology

## 2020-11-09 DIAGNOSIS — H35722 Serous detachment of retinal pigment epithelium, left eye: Secondary | ICD-10-CM | POA: Diagnosis not present

## 2020-11-09 DIAGNOSIS — H353132 Nonexudative age-related macular degeneration, bilateral, intermediate dry stage: Secondary | ICD-10-CM

## 2020-11-09 DIAGNOSIS — H353212 Exudative age-related macular degeneration, right eye, with inactive choroidal neovascularization: Secondary | ICD-10-CM | POA: Diagnosis not present

## 2020-11-09 DIAGNOSIS — H353211 Exudative age-related macular degeneration, right eye, with active choroidal neovascularization: Secondary | ICD-10-CM

## 2020-11-09 NOTE — Assessment & Plan Note (Signed)
Stable no change

## 2020-11-09 NOTE — Assessment & Plan Note (Signed)

## 2020-11-09 NOTE — Progress Notes (Signed)
11/09/2020     CHIEF COMPLAINT Patient presents for Retina Follow Up (3 Mo F/U OU//Pt denies noticeable changes to [place] OU since last visit. Pt denies ocular pain, flashes of light, or floaters OU. //)   HISTORY OF PRESENT ILLNESS: Crystal Blanchard is a 87 y.o. female who presents to the clinic today for:   HPI    Retina Follow Up    Diagnosis: Wet AMD   Laterality: right eye   Onset: 3 months ago   Severity: moderate   Duration: 3 months   Course: stable   Comments: 3 Mo F/U OU  Pt denies noticeable changes to [place] OU since last visit. Pt denies ocular pain, flashes of light, or floaters OU.          Last edited by Rockie Neighbours, [place] on 11/09/2020 10:46 AM. (History)      Referring physician: Albina Billet, MD [address]   [place],  [place] [address]  HISTORICAL INFORMATION:   Selected notes from the [facility]: No current outpatient medications on file. (Ophthalmic Drugs)   No current facility-administered medications for this visit. (Ophthalmic Drugs)   Current Outpatient Medications (Other)  Medication Sig  . aspirin 81 MG tablet Take 81 mg by mouth daily.  . benazepril (LOTENSIN) 40 MG tablet Take 40 mg by mouth every morning.   . Cholecalciferol (VITAMIN D3) 2000 UNITS TABS Take 2,000 Units by mouth daily.   Marland Kitchen doxazosin (CARDURA) 8 MG tablet Take 8 mg by mouth at bedtime.   . furosemide (LASIX) 20 MG tablet Take 20 mg by mouth daily.  Marland Kitchen HYDROcodone-acetaminophen (NORCO/VICODIN) 5-325 MG tablet Take 1 tablet by mouth every 4 (four) hours as needed for moderate pain.  Marland Kitchen levothyroxine (SYNTHROID, LEVOTHROID) 75 MCG tablet Take 75 mcg by mouth daily before breakfast.  . Multiple Vitamins-Minerals (PRESERVISION AREDS 2 PO) Take 1 capsule by mouth 2 (two) times daily.    No current facility-administered medications for this visit. (Other)      REVIEW OF SYSTEMS:    ALLERGIES No Known Allergies  PAST MEDICAL  HISTORY Past Medical History:  Diagnosis Date  . Arthritis   . Breast cancer [facility]) 2011   Wide excision, radiation therapy, tamoxifen. Intermediate grade DCIS.  Marland Kitchen Elevated blood sugar   . Hyperlipidemia   . Hypertension 1985  . Hypothyroidism   . Macular degeneration 2013   white macular degeneration-RECEIVES EYE INJECTIONS EVERY 5 WEEKS  . 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 ER 90%, PR 90%..  . Personal history of radiation therapy   . Skin cancer   . Thyroid disease 1996  . Vertigo    Past Surgical History:  Procedure Laterality Date  . ABDOMINAL HYSTERECTOMY  1968  . BREAST BIOPSY Right 12/2009   Wide excision DCIS., Mammosite placement August 2011.  Marland Kitchen BREAST BIOPSY Right 04/27/2020   Stereo bx, coil clip, path pending  . BREAST LUMPECTOMY Right 2011   DCIS  . BREAST LUMPECTOMY Right 06/02/2020   Procedure: BREAST LUMPECTOMY;  Surgeon: Robert Bellow, MD;  Location: [facility] ORS;  Service: General;  Laterality: Right;  . CATARACT EXTRACTION    . CHOLECYSTECTOMY N/A 05/28/2015   Procedure: LAPAROSCOPIC CHOLECYSTECTOMY WITH INTRAOPERATIVE CHOLANGIOGRAM;  Surgeon: Robert Bellow, MD;  Location: [facility] ORS;  Service: General;  Laterality: N/A;  . COLONOSCOPY  2011   Dr Vira Agar [facility]  . EYE SURGERY Left 2009  . EYE SURGERY Right 2010  . PERICARDIAL WINDOW      FAMILY HISTORY Family History  Problem Relation Age of Onset  . Heart failure Mother   . Cancer Father        bone  . Breast cancer Sister 65    SOCIAL HISTORY Social History   Tobacco Use  . Smoking status: Never Smoker  . Smokeless tobacco: Never Used  Vaping Use  . Vaping Use: Never used  Substance Use Topics  . Alcohol use: No  . Drug use: No         OPHTHALMIC EXAM:  Base Eye Exam    Visual Acuity (ETDRS)      Right Left   Dist [place] 20/200 20/70 -2    Dist ph [place] 20/70 20/50 +2       Tonometry (Tonopen, 10:50 AM)      Right Left   Pressure 18 14       Pupils      Pupils Dark Light Shape React APD   Right PERRL 3 3 Round Minimal None   Left PERRL 3 3 Round Minimal None       Visual Fields (Counting fingers)      Left Right    Full Full       Extraocular Movement      Right Left    Full Full       Neuro/Psych    Oriented x3: Yes   Mood/Affect: Normal       Dilation    Both eyes: 1.0% Mydriacyl, 2.5% Phenylephrine @ 10:50 AM        Slit Lamp and Fundus Exam    External Exam      Right Left   External Normal Normal       Slit Lamp Exam      Right Left   Lids/Lashes Normal Normal   Conjunctiva/Sclera White and quiet White and quiet   Cornea Clear Clear   Anterior Chamber Deep and quiet Deep and quiet   Iris Round and reactive Round and reactive   Lens Posterior chamber intraocular lens Posterior chamber intraocular lens   Anterior Vitreous Normal Normal       Fundus Exam      Right Left   Posterior Vitreous Central vitreous floaters Posterior vitreous detachment   Disc Normal Normal   C/D Ratio 0.2 0.15   Macula Hard drusen, Retinal pigment epithelial mottling, no macular thickening, Soft drusen, no hemorrhage Retinal pigment epithelial mottling, no macular thickening, Atrophy, Retinal pigment epithelial detachment   Vessels Normal Normal   Periphery Normal Normal          IMAGING AND PROCEDURES  Imaging and Procedures for 11/09/20  OCT, Retina - OU - Both Eyes       Right Eye Quality was good. Scan locations included subfoveal. Central Foveal Thickness: 233. Progression has been stable. Findings include abnormal foveal contour, retinal drusen , no IRF, no SRF, pigment epithelial detachment.   Left Eye Quality was good. Central Foveal Thickness: 250. Progression has been stable. Findings include abnormal foveal contour, retinal drusen , no IRF, pigment epithelial detachment.   Notes Pigment  epithelial detachment superotemporal to the foveal region in the right eye.  No signs of active CNVM  No signs of active CNVM OS.  ASSESSMENT/PLAN:  Exudative age-related macular degeneration of right eye with inactive choroidal neovascularization (HCC) No signs of recurrence of CNVM, last injection antivegF 7 months prior, 12-21  Exudative detachment of retinal pigment epithelium, left Stable no change  Intermediate stage nonexudative age-related macular degeneration of both eyes The nature of age--related macular degeneration was discussed with the patient as well as the distinction between dry and wet types. Checking an [place] Grid daily with advice to return immediately should a distortion develop, was given to the patient. The patient 's smoking status now and in the past was determined and advice based on the AREDS study was provided regarding the consumption of antioxidant supplements. AREDS 2 vitamin formulation was recommended. Consumption of dark leafy vegetables and fresh fruits of various colors was recommended. Treatment modalities for wet macular degeneration particularly the use of intravitreal injections of anti-blood vessel growth factors was discussed with the patient. Avastin, Lucentis, and Eylea are the available options. On occasion, therapy includes the use of photodynamic therapy and thermal laser. Stressed to the patient do not rub eyes.  Patient was advised to check [place]ler Grid daily and return immediately if changes are noted. Instructions on using the grid were given to the patient. All patient questions were answered.      ICD-10-CM   1. Exudative age-related macular degeneration of right eye with active choroidal neovascularization (HCC)  H35.3211 OCT, Retina - OU - Both Eyes  2. Exudative age-related macular degeneration of right eye with inactive choroidal neovascularization ([place])  H35.3212   3. Exudative detachment of retinal pigment  epithelium, left  H35.722   4. Intermediate stage nonexudative age-related macular degeneration of both eyes  H35.3132     1.  History of wet ARMD OD, no recurrences over the last 5 months.  We will continue with observation OU  2.  No signs of CNVM OS 3.  Ophthalmic Meds Ordered this visit:  No orders of the defined types were placed in this encounter.      Return in about 4 months (around 03/11/2021) for DILATE OU, OCT.  There are no Patient Instructions on file for this visit.   Explained the diagnoses, plan, and follow up with the patient and they expressed understanding.  Patient expressed understanding of the importance of proper follow up care.   Clent Demark Leiani Enright M.D. Diseases & Surgery of the Retina and Vitreous Retina & Diabetic [place] 11/09/20     Abbreviations: M myopia (nearsighted); A astigmatism; H hyperopia (farsighted); P presbyopia; Mrx spectacle prescription;  CTL contact lenses; OD right eye; OS left eye; OU both eyes  XT exotropia; ET esotropia; PEK punctate epithelial keratitis; PEE punctate epithelial erosions; DES dry eye syndrome; MGD meibomian gland dysfunction; ATs artificial tears; PFAT's preservative free artificial tears; [place] nuclear sclerotic cataract; PSC posterior subcapsular cataract; ERM epi-retinal membrane; PVD posterior vitreous detachment; RD retinal detachment; DM diabetes mellitus; DR diabetic retinopathy; NPDR non-proliferative diabetic retinopathy; PDR proliferative diabetic retinopathy; CSME clinically significant macular edema; DME diabetic macular edema; dbh dot blot hemorrhages; CWS cotton wool spot; POAG primary open angle glaucoma; C/D cup-to-disc ratio; HVF humphrey visual field; GVF goldmann visual field; OCT optical coherence tomography; IOP intraocular pressure; BRVO Branch retinal vein occlusion; CRVO central retinal vein occlusion; CRAO central retinal artery occlusion; BRAO branch retinal artery occlusion; RT retinal tear; SB  scleral buckle; PPV pars plana vitrectomy; VH Vitreous hemorrhage; PRP panretinal laser photocoagulation; IVK intravitreal kenalog; VMT vitreomacular traction; MH Macular hole;  NVD neovascularization of the disc;  NVE neovascularization elsewhere; AREDS age related eye disease study; ARMD age related macular degeneration; POAG primary open angle glaucoma; EBMD epithelial/anterior basement membrane dystrophy; ACIOL anterior chamber intraocular lens; IOL intraocular lens; PCIOL posterior chamber intraocular lens; Phaco/IOL phacoemulsification with intraocular lens placement; [place] photorefractive keratectomy; LASIK laser assisted in situ keratomileusis; HTN hypertension; DM diabetes mellitus; COPD chronic obstructive pulmonary disease

## 2020-11-09 NOTE — Assessment & Plan Note (Signed)
No signs of recurrence of CNVM, last injection antivegF 7 months prior, 12-21

## 2021-03-15 ENCOUNTER — Encounter (INDEPENDENT_AMBULATORY_CARE_PROVIDER_SITE_OTHER): Payer: Medicare Other | Admitting: Ophthalmology

## 2021-03-22 ENCOUNTER — Encounter (INDEPENDENT_AMBULATORY_CARE_PROVIDER_SITE_OTHER): Payer: Medicare Other | Admitting: Ophthalmology

## 2021-03-23 ENCOUNTER — Other Ambulatory Visit: Payer: Self-pay | Admitting: General Surgery

## 2021-03-23 DIAGNOSIS — C50211 Malignant neoplasm of upper-inner quadrant of right female breast: Secondary | ICD-10-CM

## 2021-03-31 ENCOUNTER — Encounter (INDEPENDENT_AMBULATORY_CARE_PROVIDER_SITE_OTHER): Payer: Medicare Other | Admitting: Ophthalmology

## 2021-04-21 ENCOUNTER — Other Ambulatory Visit: Payer: Self-pay

## 2021-04-21 ENCOUNTER — Ambulatory Visit
Admission: RE | Admit: 2021-04-21 | Discharge: 2021-04-21 | Disposition: A | Discharge status: Home / Self Care | Payer: Medicare Other | Source: Ambulatory Visit | Attending: General Surgery | Admitting: General Surgery

## 2021-04-21 DIAGNOSIS — C50211 Malignant neoplasm of upper-inner quadrant of right female breast: Secondary | ICD-10-CM | POA: Diagnosis not present

## 2021-04-21 DIAGNOSIS — Z170 Estrogen receptor positive status [ER+]: Secondary | ICD-10-CM | POA: Insufficient documentation

## 2021-05-09 ENCOUNTER — Other Ambulatory Visit: Payer: Self-pay

## 2021-05-09 ENCOUNTER — Encounter (INDEPENDENT_AMBULATORY_CARE_PROVIDER_SITE_OTHER): Payer: Self-pay | Admitting: Ophthalmology

## 2021-05-09 ENCOUNTER — Ambulatory Visit (INDEPENDENT_AMBULATORY_CARE_PROVIDER_SITE_OTHER): Payer: Medicare Other | Admitting: Ophthalmology

## 2021-05-09 DIAGNOSIS — H353212 Exudative age-related macular degeneration, right eye, with inactive choroidal neovascularization: Secondary | ICD-10-CM | POA: Diagnosis not present

## 2021-05-09 DIAGNOSIS — H35722 Serous detachment of retinal pigment epithelium, left eye: Secondary | ICD-10-CM

## 2021-05-09 DIAGNOSIS — H353132 Nonexudative age-related macular degeneration, bilateral, intermediate dry stage: Secondary | ICD-10-CM

## 2021-05-09 DIAGNOSIS — H43813 Vitreous degeneration, bilateral: Secondary | ICD-10-CM

## 2021-05-09 DIAGNOSIS — H353211 Exudative age-related macular degeneration, right eye, with active choroidal neovascularization: Secondary | ICD-10-CM

## 2021-05-09 NOTE — Assessment & Plan Note (Signed)
Stable, physiologic OD and OS

## 2021-05-09 NOTE — Progress Notes (Signed)
05/09/2021     CHIEF COMPLAINT Patient presents for  Chief Complaint  Patient presents with   Retina Follow Up      HISTORY OF PRESENT ILLNESS: Crystal Blanchard is a 87 y.o. female who presents to the clinic today for:   HPI     Retina Follow Up   Patient presents with  Wet AMD.  In both eyes.  This started 5 months ago.  Severity is mild.  Duration of 5 months.  Since onset it is stable.        Comments   5 month fu ou and oct Pt states [facility] OU stable since last visit. Pt denies FOL, floaters, or ocular pain OU.  Pt states, "I am not able to really see anything wrong with my vision. It all still looks the same to me."       Last edited by Kendra Opitz, COA on 05/09/2021  9:30 AM.      Referring physician: Albina Billet, MD [address]   [place],  [place] [address]  HISTORICAL INFORMATION:   Selected notes from the MEDICAL RECORD NUMBER       CURRENT MEDICATIONS: No current outpatient medications on file. (Ophthalmic Drugs)   No current facility-administered medications for this visit. (Ophthalmic Drugs)   Current Outpatient Medications (Other)  Medication Sig   aspirin 81 MG tablet Take 81 mg by mouth daily.   benazepril (LOTENSIN) 40 MG tablet Take 40 mg by mouth every morning.    Cholecalciferol (VITAMIN D3) 2000 UNITS TABS Take 2,000 Units by mouth daily.    doxazosin (CARDURA) 8 MG tablet Take 8 mg by mouth at bedtime.    furosemide (LASIX) 20 MG tablet Take 20 mg by mouth daily.   HYDROcodone-acetaminophen (NORCO/VICODIN) 5-325 MG tablet Take 1 tablet by mouth every 4 (four) hours as needed for moderate pain.   levothyroxine (SYNTHROID, LEVOTHROID) 75 MCG tablet Take 75 mcg by mouth daily before breakfast.   Multiple Vitamins-Minerals (PRESERVISION AREDS 2 PO) Take 1 capsule by mouth 2 (two) times daily.    No current facility-administered medications for this visit. (Other)      REVIEW OF SYSTEMS:    ALLERGIES No Known  Allergies  PAST MEDICAL HISTORY Past Medical History:  Diagnosis Date   Arthritis    Breast cancer [facility]) 2011   Wide excision, radiation therapy, tamoxifen. Intermediate grade DCIS.   Elevated blood sugar    Hyperlipidemia    Hypertension 1985   Hypothyroidism    Macular degeneration 2013   white macular degeneration-RECEIVES EYE INJECTIONS EVERY 5 WEEKS   Malignant neoplasm of upper-outer quadrant of female breast ([place]) January 18, 2010   DCIS, 6 mm.core biopsy dated December 28, 2009 showed florid ductal hyperplasia with focal atypia. ER 90%, PR 90%..   Personal history of radiation therapy    Skin cancer    Thyroid disease 1996   Vertigo    Past Surgical History:  Procedure Laterality Date   ABDOMINAL HYSTERECTOMY  1968   BREAST BIOPSY Right 12/2009   Wide excision DCIS., Mammosite placement August 2011.   BREAST BIOPSY Right 04/27/2020   Stereo bx, coil clip, positve   BREAST LUMPECTOMY Right 2011   DCIS   BREAST LUMPECTOMY Right 06/02/2020   Procedure: BREAST LUMPECTOMY;  Surgeon: Robert Bellow, MD;  Location: [facility] ORS;  Service: General;  Laterality: Right;   CATARACT EXTRACTION     CHOLECYSTECTOMY N/A 05/28/2015   Procedure: LAPAROSCOPIC CHOLECYSTECTOMY  WITH INTRAOPERATIVE CHOLANGIOGRAM;  Surgeon: Robert Bellow, MD;  Location: [facility] ORS;  Service: General;  Laterality: N/A;   COLONOSCOPY  2011   Dr Vira Agar [facility]   EYE SURGERY Left 2009   EYE SURGERY Right 2010   PERICARDIAL WINDOW      FAMILY HISTORY Family History  Problem Relation Age of Onset   Heart failure Mother    Cancer Father        bone   Breast cancer Sister 68    SOCIAL HISTORY Social History   Tobacco Use   Smoking status: Never   Smokeless tobacco: Never  Vaping Use   Vaping Use: Never used  Substance Use Topics   Alcohol use: No   Drug use: No         OPHTHALMIC EXAM:  Base Eye Exam     Visual Acuity (ETDRS)       Right Left   Dist [place] 20/200 20/100 -1   Dist ph [place] 20/60  20/40 -2         Tonometry (Tonopen, 9:34 AM)       Right Left   Pressure 10 10         Pupils       Pupils Dark Light Shape React APD   Right PERRL 3 3 Round Minimal None   Left PERRL 3 3 Round Minimal None         Visual Fields (Counting fingers)       Left Right    Full Full         Extraocular Movement       Right Left    Full Full         Neuro/Psych     Oriented x3: Yes   Mood/Affect: Normal         Dilation     Both eyes: 1.0% Mydriacyl, 2.5% Phenylephrine @ 9:34 AM           Slit Lamp and Fundus Exam     External Exam       Right Left   External Normal Normal         Slit Lamp Exam       Right Left   Lids/Lashes Normal Normal   Conjunctiva/Sclera White and quiet White and quiet   Cornea Clear Clear   Anterior Chamber Deep and quiet Deep and quiet   Iris Round and reactive Round and reactive   Lens Posterior chamber intraocular lens Posterior chamber intraocular lens   Anterior Vitreous Normal Normal         Fundus Exam       Right Left   Posterior Vitreous Central vitreous floaters Posterior vitreous detachment   Disc Normal Normal   C/D Ratio 0.2 0.15   Macula Hard drusen, Retinal pigment epithelial mottling, no macular thickening, Soft drusen, no hemorrhage Retinal pigment epithelial mottling, no macular thickening, Atrophy, Retinal pigment epithelial detachment   Vessels Normal Normal   Periphery Normal Normal            IMAGING AND PROCEDURES  Imaging and Procedures for 05/09/21  OCT, Retina - OU - Both Eyes       Right Eye Quality was good. Scan locations included subfoveal. Central Foveal Thickness: 229. Progression has been stable. Findings include abnormal foveal contour, retinal drusen , no IRF, no SRF, pigment epithelial detachment.   Left Eye Quality was good. Scan locations included subfoveal. Central Foveal Thickness: 241. Progression has been stable. Findings include abnormal  foveal contour,  retinal drusen , no IRF, pigment epithelial detachment.   Notes Pigment epithelial detachment superotemporal to the foveal region in the right eye.  No signs of active CNVM, OD now some 9 Months post recent rx  No signs of active CNVM OS.             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 Exudative age-related macular degeneration of right eye with active choroidal neovascularization (HCC)  H35.3211 OCT, Retina - OU - Both Eyes    2. Exudative detachment of retinal pigment epithelium, left  H35.722 OCT, Retina - OU - Both Eyes    3. Exudative age-related macular degeneration of right eye with inactive choroidal neovascularization ([place])  H35.3212     4. Intermediate stage nonexudative age-related macular degeneration of both eyes  H35.3132     5. Posterior vitreous detachment of both eyes  H43.813       1.  OD and OS, no sign of recurrent CNVM, with preserved good acuity will continue to observe  2.  3.  Ophthalmic Meds Ordered this visit:  No orders of the defined types were placed in this encounter.      Return in about 6 months (around 11/06/2021) for DILATE OU, OCT, OD, OS, COLOR FP.  There are no Patient Instructions on file for this visit.   Explained the diagnoses, plan, and follow up with the patient and they expressed understanding.  Patient expressed understanding of the importance of proper follow up care.   Clent Demark Monic Engelmann M.D. Diseases & Surgery of the Retina and  Vitreous Retina & Diabetic [place] 05/09/21     Abbreviations: M myopia (nearsighted); A astigmatism; H hyperopia (farsighted); P presbyopia; Mrx spectacle prescription;  CTL contact lenses; OD right eye; OS left eye; OU both eyes  XT exotropia; ET esotropia; PEK punctate epithelial keratitis; PEE punctate epithelial erosions; DES dry eye syndrome; MGD meibomian gland dysfunction; ATs artificial tears; PFAT's preservative free artificial tears; [place] nuclear sclerotic cataract; PSC posterior subcapsular cataract; ERM epi-retinal membrane; PVD posterior vitreous detachment; RD retinal detachment; DM diabetes mellitus; DR diabetic retinopathy; NPDR non-proliferative diabetic retinopathy; PDR proliferative diabetic retinopathy; CSME clinically significant macular edema; DME diabetic macular edema; dbh dot blot hemorrhages; CWS cotton wool spot; POAG primary open angle glaucoma; C/D cup-to-disc ratio; HVF humphrey visual field; GVF goldmann visual field; OCT optical coherence tomography; IOP intraocular pressure; BRVO Branch retinal vein occlusion; CRVO central retinal vein occlusion; CRAO central retinal artery occlusion; BRAO branch retinal artery occlusion; RT retinal tear; SB scleral buckle; PPV pars plana vitrectomy; VH Vitreous hemorrhage; PRP panretinal laser photocoagulation; IVK intravitreal kenalog; VMT vitreomacular traction; MH Macular hole;  NVD neovascularization of the disc; NVE neovascularization elsewhere; AREDS age related eye disease study; ARMD age related macular degeneration; POAG primary open angle glaucoma; EBMD epithelial/anterior basement membrane dystrophy; ACIOL anterior chamber intraocular lens; IOL intraocular lens; PCIOL posterior chamber intraocular lens; Phaco/IOL phacoemulsification with intraocular lens placement; [place] photorefractive keratectomy; LASIK laser assisted in situ keratomileusis; HTN hypertension; DM diabetes mellitus; COPD chronic obstructive pulmonary disease

## 2021-05-09 NOTE — Assessment & Plan Note (Signed)
Condition resolved 

## 2021-05-09 NOTE — Assessment & Plan Note (Signed)
No signs of recurrence of CNVM last therapy some 9 months previous

## 2021-05-09 NOTE — Assessment & Plan Note (Signed)
No center involved atrophy OU, with pinhole acuity stable OD at 20/60 and OS 20/40

## 2021-11-08 ENCOUNTER — Encounter (INDEPENDENT_AMBULATORY_CARE_PROVIDER_SITE_OTHER): Payer: Medicare Other | Admitting: Ophthalmology

## 2021-11-15 ENCOUNTER — Ambulatory Visit (INDEPENDENT_AMBULATORY_CARE_PROVIDER_SITE_OTHER): Payer: Medicare Other | Admitting: Ophthalmology

## 2021-11-15 ENCOUNTER — Encounter (INDEPENDENT_AMBULATORY_CARE_PROVIDER_SITE_OTHER): Payer: Self-pay | Admitting: Ophthalmology

## 2021-11-15 DIAGNOSIS — H353211 Exudative age-related macular degeneration, right eye, with active choroidal neovascularization: Secondary | ICD-10-CM | POA: Diagnosis not present

## 2021-11-15 DIAGNOSIS — H353212 Exudative age-related macular degeneration, right eye, with inactive choroidal neovascularization: Secondary | ICD-10-CM | POA: Diagnosis not present

## 2021-11-15 DIAGNOSIS — H353133 Nonexudative age-related macular degeneration, bilateral, advanced atrophic without subfoveal involvement: Secondary | ICD-10-CM

## 2021-11-15 DIAGNOSIS — H35722 Serous detachment of retinal pigment epithelium, left eye: Secondary | ICD-10-CM

## 2021-11-15 NOTE — Assessment & Plan Note (Addendum)
The nature of dry age related macular degeneration was discussed with the patient as well as its possible conversion to wet. The results of the AREDS 2 study was discussed with the patient. A diet rich in dark leafy green vegetables was advised and specific recommendations were made regarding supplements with AREDS 2 formulation . Control of hypertension and serum cholesterol may slow the disease. Smoking cessation is mandatory to slow the disease and diminish the risk of progressing to wet age related macular degeneration. The patient was instructed in the use of an [place] and was told to return immediately for any changes in the Grid. Stressed to the patient do not rub eyesAs found by other fluorescence and red free examination.  Extensive posterior pole geographic atrophy present yet with preserved acuity.  May be a candidate for Syfovre in the coming months when available

## 2021-11-15 NOTE — Assessment & Plan Note (Signed)
No sign of recurrence of CNVM OD

## 2021-11-15 NOTE — Progress Notes (Signed)
11/15/2021     CHIEF COMPLAINT Patient presents for  Chief Complaint  Patient presents with   Macular Degeneration      HISTORY OF PRESENT ILLNESS: Crystal Blanchard is a 88 y.o. female who presents to the clinic today for:   HPI   6 mos dilate OU, OCT, Color FP. Patient states "a little change. I notice it is a little fuzzy. It is harder to read." Denies any new floaters or FOL. Patient takes PreserVision AREDS twice daily. Patient states "sometimes I have headaches and it feels like it is behind my left eye." Patient reports no hospitalizations or surgeries or procedures since last visit.  Last edited by Laurin Coder on 11/15/2021 10:10 AM.      Referring physician: Anell Barr, [place],  [place] [address]  HISTORICAL INFORMATION:   Selected notes from the [facility]: No current outpatient medications on file. (Ophthalmic Drugs)   No current facility-administered medications for this visit. (Ophthalmic Drugs)   Current Outpatient Medications (Other)  Medication Sig   aspirin 81 MG tablet Take 81 mg by mouth daily.   benazepril (LOTENSIN) 40 MG tablet Take 40 mg by mouth every morning.    Cholecalciferol (VITAMIN D3) 2000 UNITS TABS Take 2,000 Units by mouth daily.    doxazosin (CARDURA) 8 MG tablet Take 8 mg by mouth at bedtime.    furosemide (LASIX) 20 MG tablet Take 20 mg by mouth daily.   levothyroxine (SYNTHROID, LEVOTHROID) 75 MCG tablet Take 75 mcg by mouth daily before breakfast.   Multiple Vitamins-Minerals (PRESERVISION AREDS 2 PO) Take 1 capsule by mouth 2 (two) times daily.    No current facility-administered medications for this visit. (Other)      REVIEW OF SYSTEMS: ROS   Negative for: Constitutional, Gastrointestinal, Neurological, Skin, Genitourinary, Musculoskeletal, HENT, Endocrine, Cardiovascular, Eyes, Respiratory, Psychiatric, Allergic/Imm, Heme/Lymph Last edited by Hurman Horn, MD  on 11/15/2021 11:08 AM.       ALLERGIES No Known Allergies  PAST MEDICAL HISTORY Past Medical History:  Diagnosis Date   Arthritis    Breast cancer [facility]) 2011   Wide excision, radiation therapy, tamoxifen. Intermediate grade DCIS.   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 ER 90%, PR 90%..   Personal history of radiation therapy    Skin cancer    Thyroid disease 1996   Vertigo    Past Surgical History:  Procedure Laterality Date   ABDOMINAL HYSTERECTOMY  1968   BREAST BIOPSY Right 12/2009   Wide excision DCIS., Mammosite placement August 2011.   BREAST BIOPSY Right 04/27/2020   Stereo bx, coil clip, positve   BREAST LUMPECTOMY Right 2011   DCIS   BREAST LUMPECTOMY Right 06/02/2020   Procedure: BREAST LUMPECTOMY;  Surgeon: Robert Bellow, MD;  Location: [facility] ORS;  Service: General;  Laterality: Right;   CATARACT EXTRACTION     CHOLECYSTECTOMY N/A 05/28/2015   Procedure: LAPAROSCOPIC CHOLECYSTECTOMY WITH INTRAOPERATIVE CHOLANGIOGRAM;  Surgeon: Robert Bellow, MD;  Location: [facility] ORS;  Service: General;  Laterality: N/A;   COLONOSCOPY  2011   Dr Vira Agar [facility]  EYE SURGERY Left 2009   EYE SURGERY Right 2010   PERICARDIAL WINDOW      FAMILY HISTORY Family History  Problem Relation Age of Onset   Heart failure Mother    Cancer Father        bone   Breast cancer Sister 68    SOCIAL HISTORY Social History   Tobacco Use   Smoking status: Never   Smokeless tobacco: Never  Vaping Use   Vaping Use: Never used  Substance Use Topics   Alcohol use: No   Drug use: No         OPHTHALMIC EXAM:  Base Eye Exam     Visual Acuity (ETDRS)       Right  Left   Dist [place] 20/50 20/60 -2   Dist ph [place] NI 20/50 -2         Tonometry (Tonopen, 10:12 AM)       Right Left   Pressure 12 13         Pupils       Pupils Dark Light APD   Right PERRL 4 3 None   Left PERRL 4 3 None         Visual Fields (Counting fingers)       Left Right    Full Full         Extraocular Movement       Right Left    Full Full         Neuro/Psych     Oriented x3: Yes   Mood/Affect: Normal         Dilation     Both eyes: 1.0% Mydriacyl, 2.5% Phenylephrine @ 10:12 AM           Slit Lamp and Fundus Exam     External Exam       Right Left   External Normal Normal         Slit Lamp Exam       Right Left   Lids/Lashes Normal Normal   Conjunctiva/Sclera White and quiet White and quiet   Cornea Clear Clear   Anterior Chamber Deep and quiet Deep and quiet   Iris Round and reactive Round and reactive   Lens Posterior chamber intraocular lens Posterior chamber intraocular lens   Anterior Vitreous Normal Normal         Fundus Exam       Right Left   Posterior Vitreous Central vitreous floaters Posterior vitreous detachment   Disc Normal Normal   C/D Ratio 0.2 0.15   Macula Hard drusen, Retinal pigment epithelial mottling, no macular thickening, Soft drusen, no hemorrhage Retinal pigment epithelial mottling, no macular thickening, Atrophy, Retinal pigment epithelial detachment   Vessels Normal Normal   Periphery Normal Normal            IMAGING AND PROCEDURES  Imaging and Procedures for 11/15/21  OCT, Retina - OU - Both Eyes       Right Eye Quality was good. Scan locations included subfoveal. Central Foveal Thickness: 224. Progression has been stable. Findings include abnormal foveal contour, retinal drusen , no IRF, no SRF, pigment epithelial detachment.   Left Eye Quality was borderline. Scan locations included subfoveal. Progression has been stable. Findings include abnormal foveal contour, retinal drusen  , no IRF, pigment epithelial detachment.   Notes Pigment epithelial detachment superotemporal to the foveal region in the right eye.  No signs of active CNVM, OD now some 9 Months post recent rx  No signs of active CNVM OS.     Color Fundus Photography Optos - OU - Both Eyes       Right Eye Progression has been stable. Disc findings include normal observations. Macula : geographic atrophy, retinal pigment epithelium abnormalities, drusen. Vessels : normal observations. Periphery : normal observations.   Left Eye Progression has been stable. Macula : drusen, retinal pigment epithelium abnormalities, geographic atrophy. Vessels : normal observations. Periphery : normal observations.   Notes No signs of CNVM, pigment epithelial detachment RPE changes are noted at macular region OU  Autofluorescence shows areas of extensive RPE atrophy in the macular para macular area OU             ASSESSMENT/PLAN:  Advanced nonexudative age-related macular degeneration of both eyes without subfoveal involvement The nature of dry age related macular degeneration was discussed with the patient as well as its possible conversion to wet. The results of the AREDS 2 study was discussed with the patient. A diet rich in dark leafy green vegetables was advised and specific recommendations were made regarding supplements with AREDS 2 formulation . Control of hypertension and serum cholesterol may slow the disease. Smoking cessation is mandatory to slow the disease and diminish the risk of progressing to wet age related macular degeneration. The patient was instructed in the use of an [place] and was told to return immediately for any changes in the Grid. Stressed to the patient do not rub eyesAs found by other fluorescence and red free examination.  Extensive posterior pole geographic atrophy present yet with preserved acuity.  May be a candidate for Syfovre in the coming months when available  Exudative  age-related macular degeneration of right eye with inactive choroidal neovascularization ([place]) No sign of recurrence of CNVM OD     ICD-10-CM   1. Exudative age-related macular degeneration of right eye with active choroidal neovascularization (HCC)  H35.3211 OCT, Retina - OU - Both Eyes    Color Fundus Photography Optos - OU - Both Eyes    2. Exudative detachment of retinal pigment epithelium, left  H35.722 OCT, Retina - OU - Both Eyes    Color Fundus Photography Optos - OU - Both Eyes    3. Advanced nonexudative age-related macular degeneration of both eyes without subfoveal involvement  H35.3133     4. Exudative age-related macular degeneration of right eye with inactive choroidal neovascularization ([place])  H35.3212       1.  Currently no signs of complications of dry AMD.  No signs of wet AMD in either eye  2.  OU with good acuity yet with extensive signs of dry atrophic age-related macular degeneration for which week previous to this year there is been no therapy available.  As of October 2023, Syfovre will be available for injectable medicine which can prevent the progression of the dry macular degeneration and preserve visual acuity  3.  Ophthalmic Meds Ordered this visit:  No orders of the defined types were placed in this encounter.      Return in about 5 months (around 04/17/2022) for DILATE OU, COLOR FP, OCT,, consider use of Syfovre right eye initially.  There are no Patient Instructions on file for this visit.   Explained the diagnoses, plan, and follow up with the patient and they expressed understanding.  Patient expressed understanding of the importance of proper follow up care.   Clent Demark Donterrius Santucci M.D. Diseases & Surgery of the Retina and Vitreous [facility] [place] 11/15/21  Abbreviations: M myopia (nearsighted); A astigmatism; H hyperopia (farsighted); P presbyopia; Mrx spectacle prescription;  CTL contact lenses; OD right eye; OS left eye; OU  both eyes  XT exotropia; ET esotropia; PEK punctate epithelial keratitis; PEE punctate epithelial erosions; DES dry eye syndrome; MGD meibomian gland dysfunction; ATs artificial tears; PFAT's preservative free artificial tears; [place] nuclear sclerotic cataract; PSC posterior subcapsular cataract; ERM epi-retinal membrane; PVD posterior vitreous detachment; RD retinal detachment; DM diabetes mellitus; DR diabetic retinopathy; NPDR non-proliferative diabetic retinopathy; PDR proliferative diabetic retinopathy; CSME clinically significant macular edema; DME diabetic macular edema; dbh dot blot hemorrhages; CWS cotton wool spot; POAG primary open angle glaucoma; C/D cup-to-disc ratio; HVF humphrey visual field; GVF goldmann visual field; OCT optical coherence tomography; IOP intraocular pressure; BRVO Branch retinal vein occlusion; CRVO central retinal vein occlusion; CRAO central retinal artery occlusion; BRAO branch retinal artery occlusion; RT retinal tear; SB scleral buckle; PPV pars plana vitrectomy; VH Vitreous hemorrhage; PRP panretinal laser photocoagulation; IVK intravitreal kenalog; VMT vitreomacular traction; MH Macular hole;  NVD neovascularization of the disc; NVE neovascularization elsewhere; AREDS age related eye disease study; ARMD age related macular degeneration; POAG primary open angle glaucoma; EBMD epithelial/anterior basement membrane dystrophy; ACIOL anterior chamber intraocular lens; IOL intraocular lens; PCIOL posterior chamber intraocular lens; Phaco/IOL phacoemulsification with intraocular lens placement; [place] photorefractive keratectomy; LASIK laser assisted in situ keratomileusis; HTN hypertension; DM diabetes mellitus; COPD chronic obstructive pulmonary disease

## 2022-03-15 ENCOUNTER — Other Ambulatory Visit: Payer: Self-pay | Admitting: General Surgery

## 2022-03-15 DIAGNOSIS — C50211 Malignant neoplasm of upper-inner quadrant of right female breast: Secondary | ICD-10-CM

## 2022-04-17 ENCOUNTER — Encounter (INDEPENDENT_AMBULATORY_CARE_PROVIDER_SITE_OTHER): Payer: Medicare Other | Admitting: Ophthalmology

## 2022-04-24 ENCOUNTER — Ambulatory Visit
Admission: RE | Admit: 2022-04-24 | Discharge: 2022-04-24 | Disposition: A | Discharge status: Home / Self Care | Payer: Medicare Other | Source: Ambulatory Visit | Attending: General Surgery | Admitting: General Surgery

## 2022-04-24 DIAGNOSIS — Z170 Estrogen receptor positive status [ER+]: Secondary | ICD-10-CM | POA: Insufficient documentation

## 2022-04-24 DIAGNOSIS — C50211 Malignant neoplasm of upper-inner quadrant of right female breast: Secondary | ICD-10-CM | POA: Insufficient documentation

## 2022-09-14 ENCOUNTER — Emergency Department: Payer: Medicare Other

## 2022-09-14 ENCOUNTER — Other Ambulatory Visit: Payer: Self-pay

## 2022-09-14 ENCOUNTER — Emergency Department
Admission: EM | Admit: 2022-09-14 | Discharge: 2022-09-14 | Disposition: A | Discharge status: Home / Self Care | Payer: Medicare Other | Attending: Emergency Medicine | Admitting: Emergency Medicine

## 2022-09-14 DIAGNOSIS — Q670 Congenital facial asymmetry: Secondary | ICD-10-CM

## 2022-09-14 DIAGNOSIS — G319 Degenerative disease of nervous system, unspecified: Secondary | ICD-10-CM | POA: Insufficient documentation

## 2022-09-14 DIAGNOSIS — R29810 Facial weakness: Secondary | ICD-10-CM | POA: Diagnosis present

## 2022-09-14 DIAGNOSIS — E039 Hypothyroidism, unspecified: Secondary | ICD-10-CM | POA: Insufficient documentation

## 2022-09-14 DIAGNOSIS — R9082 White matter disease, unspecified: Secondary | ICD-10-CM | POA: Insufficient documentation

## 2022-09-14 DIAGNOSIS — G510 Bell's palsy: Secondary | ICD-10-CM | POA: Insufficient documentation

## 2022-09-14 DIAGNOSIS — Z79899 Other long term (current) drug therapy: Secondary | ICD-10-CM | POA: Insufficient documentation

## 2022-09-14 DIAGNOSIS — Z853 Personal history of malignant neoplasm of breast: Secondary | ICD-10-CM | POA: Diagnosis not present

## 2022-09-14 DIAGNOSIS — I10 Essential (primary) hypertension: Secondary | ICD-10-CM | POA: Insufficient documentation

## 2022-09-14 LAB — COMPREHENSIVE METABOLIC PANEL
ALT: 12 U/L (ref 0–44)
AST: 30 U/L (ref 15–41)
Albumin: 3.2 g/dL — ABNORMAL LOW (ref 3.5–5.0)
Alkaline Phosphatase: 49 U/L (ref 38–126)
Anion gap: 8 (ref 5–15)
BUN: 15 mg/dL (ref 8–23)
CO2: 24 mmol/L (ref 22–32)
Calcium: 8.9 mg/dL (ref 8.9–10.3)
Chloride: 105 mmol/L (ref 98–111)
Creatinine, Ser: 0.72 mg/dL (ref 0.44–1.00)
GFR, Estimated: >60 mL/min (ref >60)
Glucose, Bld: 136 mg/dL — ABNORMAL HIGH (ref 70–99)
Potassium: 4.1 mmol/L (ref 3.5–5.1)
Sodium: 137 mmol/L (ref 135–145)
Total Bilirubin: 1.6 mg/dL — ABNORMAL HIGH (ref 0.3–1.2)
Total Protein: 6.3 g/dL — ABNORMAL LOW (ref 6.5–8.1)

## 2022-09-14 LAB — CBC
HCT: 40.1 % (ref 36.0–46.0)
Hemoglobin: 13.3 g/dL (ref 12.0–15.0)
MCH: 31.1 pg (ref 26.0–34.0)
MCHC: 33.2 g/dL (ref 30.0–36.0)
MCV: 93.7 fL (ref 80.0–100.0)
Platelets: 176 10*3/uL (ref 150–400)
RBC: 4.28 MIL/uL (ref 3.87–5.11)
RDW: 13.3 % (ref 11.5–15.5)
WBC: 11.3 10*3/uL — ABNORMAL HIGH (ref 4.0–10.5)
nRBC: 0 % (ref 0.0–0.2)

## 2022-09-14 LAB — DIFFERENTIAL
Abs Immature Granulocytes: 0.07 10*3/uL (ref 0.00–0.07)
Basophils Absolute: 0.1 10*3/uL (ref 0.0–0.1)
Basophils Relative: 1 %
Eosinophils Absolute: 0.2 10*3/uL (ref 0.0–0.5)
Eosinophils Relative: 1 %
Immature Granulocytes: 1 %
Lymphocytes Relative: 30 %
Lymphs Abs: 3.4 10*3/uL (ref 0.7–4.0)
Monocytes Absolute: 1.1 10*3/uL — ABNORMAL HIGH (ref 0.1–1.0)
Monocytes Relative: 9 %
Neutro Abs: 6.6 10*3/uL (ref 1.7–7.7)
Neutrophils Relative %: 58 %

## 2022-09-14 LAB — APTT: aPTT: 27 s (ref 24–36)

## 2022-09-14 LAB — PROTIME-INR
INR: 1.1 (ref 0.8–1.2)
Prothrombin Time: 13.6 seconds (ref 11.4–15.2)

## 2022-09-14 LAB — CBG MONITORING, ED: Glucose-Capillary: 121 mg/dL — ABNORMAL HIGH (ref 70–99)

## 2022-09-14 LAB — ETHANOL: Alcohol, Ethyl (B): <10 mg/dL (ref <10)

## 2022-09-14 MED ORDER — ACYCLOVIR 400 MG PO TABS: 400.0000 mg | ORAL_TABLET | Freq: Every day | ORAL | 0 refills | Status: AC

## 2022-09-14 MED ORDER — HYPROMELLOSE (GONIOSCOPIC) 2.5 % OP SOLN: 2.0000 [drp] | Freq: Four times a day (QID) | OPHTHALMIC | 12 refills | Status: AC

## 2022-09-14 MED ORDER — PREDNISONE 20 MG PO TABS
60.0000 mg | ORAL_TABLET | Freq: Once | ORAL | Status: AC
  Administered 2022-09-14: 60 mg via ORAL
  Filled 2022-09-14: qty 3

## 2022-09-14 MED ORDER — PREDNISONE 50 MG PO TABS: ORAL_TABLET | ORAL | 0 refills | Status: AC

## 2022-09-14 NOTE — ED Triage Notes (Signed)
Pt arrived via EMS from home for complaint of left sided facial droop. Per EMS pt was making breakfast around 0600 and the family noticed she started having left side facial droop. Pt denies any hx of previous strokes. Pt denies blood thinner use, CP, Sob, N/V/D. Pt is A&Ox4.

## 2022-09-14 NOTE — Discharge Instructions (Addendum)
Take prednisone and acyclovir for Bell's palsy for the full course as prescribed.  Since the left side of your face is not moving as it normally would, your left eye is prone to drying out because your eyelid does not fully closed especially at night when you are sleeping.  Use eyedrops throughout the day as prescribed to moisten the eye and then at nighttime make sure you tape the left eye completely shut to avoid it drying out.  Call neurologist Dr. Melrose Nakayama for a follow-up appointment in 4 weeks.  Call your primary doctor for a follow-up visit within the next 2 weeks.  Your blood pressure was high in the emergency department -when you are home and resting, recheck your blood pressure and make sure you talk with your primary doctor about elevated blood pressure and adjustments of your home medications as needed.

## 2022-09-14 NOTE — ED Provider Notes (Signed)
[facility] Provider Note    Event Date/Time   First MD Initiated Contact with Patient 09/14/22 [phone]     (approximate)   History   Code Stroke   HPI  Crystal Blanchard is a 89 y.o. female   Past medical history of hypertension, hypothyroid, hyperlipidemia, breast cancer status post resection, radiation therapy who presents emergency department as a stroke code.  Awoke this morning in her regular state of health and family member noticed that she had a facial droop on the left side.  No symptoms reported in the days preceding, no obvious inciting event has otherwise been in her regular state of health and offers no other acute medical complaints.  Upon arrival to the emergency department neurology is at bedside as EMS activated stroke code.  She has a left-sided facial droop that involves both the upper and lower sides.  She has no other notable focal deficits.  Her left eye slightly red  Independent Historian contributed to assessment above: EMS  External Medical Documents Reviewed: Ophthalmology clinic visit from November 2022 with age-related macular degeneration of the both eyes      Physical Exam   Triage Vital Signs: ED Triage Vitals  Enc Vitals Group     BP      Pulse      Resp      Temp      Temp src      SpO2      Weight      Height      Head Circumference      Peak Flow      Pain Score      Pain Loc      Pain Edu?      Excl. in [place]?     Most recent vital signs: Vitals:   09/14/22 0945 09/14/22 1100  BP: (!) 240/91 (!) 217/88  Pulse: 66 68  Resp: 20 13  Temp:    SpO2: 95% 96%    General: Awake, no distress.  CV:  Good peripheral perfusion.  Resp:  Normal effort.  Abd:  No distention.  Other:  Comfortable appearing oriented answering all questions appropriately.  Left-sided facial droop and involving the upper and lower portions the motor or sensory exam is intact she has no loss in hearing and no obvious facial lesions or  lesions in the ear canal   ED Results / Procedures / Treatments   Labs (all labs ordered are listed, but only abnormal results are displayed) Labs Reviewed  CBC - Abnormal; Notable for the following components:      Result Value   WBC 11.3 (*)    All other components within normal limits  DIFFERENTIAL - Abnormal; Notable for the following components:   Monocytes Absolute 1.1 (*)    All other components within normal limits  COMPREHENSIVE METABOLIC PANEL - Abnormal; Notable for the following components:   Glucose, Bld 136 (*)    Total Protein 6.3 (*)    Albumin 3.2 (*)    Total Bilirubin 1.6 (*)    All other components within normal limits  CBG MONITORING, ED - Abnormal; Notable for the following components:   Glucose-Capillary 121 (*)    All other components within normal limits  ETHANOL  PROTIME-INR  APTT     I ordered and reviewed the above labs they are notable for her blood glucose is 121  EKG  ED ECG REPORT I, Lucillie Garfinkel, the attending physician, personally viewed and  interpreted this ECG.   Date: 09/14/2022  EKG Time: 0912  Rate: 67  Rhythm: nsr  Axis: LAD  Intervals:nl  ST&T Change: no stemi    RADIOLOGY I independently reviewed and interpreted CT head and I see no obvious bleeding or midline shift   PROCEDURES:  Critical Care performed: Yes, see critical care procedure note(s)  .Critical Care  Performed by: Lucillie Garfinkel, MD Authorized by: Lucillie Garfinkel, MD   Critical care provider statement:    Critical care time (minutes):  30   Critical care was time spent personally by me on the following activities:  Development of treatment plan with patient or surrogate, discussions with consultants, evaluation of patient's response to treatment, examination of patient, ordering and review of laboratory studies, ordering and review of radiographic studies, ordering and performing treatments and interventions, pulse oximetry, re-evaluation of patient's condition  and review of old charts    MEDICATIONS ORDERED IN ED: Medications  predniSONE (DELTASONE) tablet 60 mg (60 mg Oral Given 09/14/22 1106)    External physician / consultants:  I spoke with Dr. Rory Percy of neurology regarding care plan for this patient.   IMPRESSION / MDM / ASSESSMENT AND PLAN / ED COURSE  I reviewed the triage vital signs and the nursing notes.                                Patient's presentation is most consistent with acute presentation with potential threat to life or bodily function.  Differential diagnosis includes, but is not limited to,, Bell's palsy, stroke, mass lesion or aneurysm intracranial, herpetic infection   The patient is on the cardiac monitor to evaluate for evidence of arrhythmia and/or significant heart rate changes.  MDM: This is a patient who awoke with symptoms most consistent with Bell's palsy.  Assessed by neurologist at bedside and taken to CT scanner for stroke evaluation.  She has no other acute medical complaints and no other focal findings, will wait for neurology for recommendations.   I considered hospitalization for admission or observation however given negative stroke workup and neurologic consultation recommends outpatient follow-up and treatment for Bell's palsy she will be discharged now with prednisone and acyclovir and follow-up with neurology in 4 weeks and PMD in 2 weeks return precautions given.        FINAL CLINICAL IMPRESSION(S) / ED DIAGNOSES   Final diagnoses:  Facial asymmetry  Bell's palsy  Uncontrolled hypertension     Rx / DC Orders   ED Discharge Orders          Ordered    predniSONE (DELTASONE) 50 MG tablet        09/14/22 1050    acyclovir (ZOVIRAX) 400 MG tablet  5 times daily        09/14/22 1050    hydroxypropyl methylcellulose / hypromellose (ISOPTO TEARS / GONIOVISC) 2.5 % ophthalmic solution  4 times daily        09/14/22 1051             Note:  This document was prepared using  Dragon voice recognition software and may include unintentional dictation errors.    Lucillie Garfinkel, MD 09/14/22 [phone]

## 2022-09-14 NOTE — Consult Note (Signed)
Neurology Consultation  Reason for Consult: Left facial droop Referring Physician: Dr. Jacelyn Grip  CC: left facial droop-code stroke  History is obtained from: Patient, chart  HPI: Crystal Blanchard is a 89 y.o. 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  According to the report provided by the patient and EMS, she woke up this morning at around 6, was making breakfast when family noted that she had weakness of the left face with a left facial drooping.  Her left eyelid was also not closing completely.  Her left eye was also very red. She was evaluated on scene and brought in for concern for stroke for emergent evaluation at the hospital. She was seen at the bridge. Denies any headaches.  Denies any change in taste.  Denies any hearing changes. Her clinical exam-documented in detail below was more consistent with a LMN facial nerve palsy but given her age and risk factors, CT head code stroke protocol was performed which showed no acute changes. Due to low suspicion for stroke, angiography was not performed An MRI will be recommended   LKW: 6 AM IV thrombolysis given?: no, likely Bell's palsy and not a stroke EVT: No-not consistent with an LVO stroke-rather Bell's palsy Premorbid modified Rankin scale (mRS):1   ROS: Full ROS was performed and is negative except as noted in the HPI.  Past Medical History:  Diagnosis Date   Arthritis    Breast cancer [facility]) 2011   Wide excision, radiation therapy, tamoxifen. Intermediate grade DCIS.   Elevated blood sugar    Hyperlipidemia    Hypertension 1985   Hypothyroidism    Macular degeneration 2013   white macular degeneration-RECEIVES EYE INJECTIONS EVERY 5 WEEKS   Malignant neoplasm of upper-outer quadrant of female breast ([place]) January 18, 2010   DCIS, 6 mm.core biopsy dated  December 28, 2009 showed florid ductal hyperplasia with focal atypia. ER 90%, PR 90%..   Personal history of radiation therapy    Skin cancer    Thyroid disease 1996   Vertigo    Family History  Problem Relation Age of Onset   Heart failure Mother    Cancer Father        bone   Breast cancer Sister 72   Social History:   reports that she has never smoked. She has never used smokeless tobacco. She reports that she does not drink alcohol and does not use drugs.  Medications No current facility-administered medications for this encounter.  Current Outpatient Medications:    aspirin 81 MG tablet, Take 81 mg by mouth daily., Disp: , Rfl:    benazepril (LOTENSIN) 40 MG tablet, Take 40 mg by mouth every morning. , Disp: , Rfl:    Cholecalciferol (VITAMIN D3) 2000 UNITS TABS, Take 2,000 Units by mouth daily. , Disp: , Rfl:    doxazosin (CARDURA) 8 MG tablet, Take 8 mg by mouth at bedtime. , Disp: , Rfl:    furosemide (LASIX) 20 MG tablet, Take 20 mg by mouth daily., Disp: , Rfl:    levothyroxine (SYNTHROID, LEVOTHROID) 75 MCG tablet, Take 75 mcg by mouth daily before breakfast., Disp: , Rfl:    Multiple Vitamins-Minerals (PRESERVISION AREDS 2 PO), Take 1 capsule by mouth 2 (two) times daily. , Disp: , Rfl:   Exam: Current vital signs: BP (!) 240/91   Pulse 66   Temp 97.9 F (  36.6 C)   Resp 20   Ht 5\' 6"  (1.676 m)   Wt 84.7 kg   SpO2 95%   BMI 30.14 kg/m  Vital signs in last 24 hours: Temp:  [97.9 F (36.6 C)] 97.9 F (36.6 C) (04/04 0814) Pulse Rate:  [66-73] 66 (04/04 0945) Resp:  [16-20] 20 (04/04 0945) BP: (188-252)/(84-98) 240/91 (04/04 0945) SpO2:  [94 %-95 %] 95 % (04/04 0945) Weight:  [84.7 kg] 84.7 kg (04/04 0847)  GENERAL: Awake, alert in NAD HEENT: - Normocephalic and atraumatic, dry mm, no LN++, no Thyromegally LUNGS - Clear to auscultation bilaterally with no wheezes CV - S1S2 RRR, no m/r/g, equal pulses bilaterally. ABDOMEN - Soft, nontender, nondistended with  normoactive BS Ext: warm, well perfused, intact peripheral pulses, trace edema bilaterally  NEURO:  Mental Status: AA&Ox3  Language: speech is nondysarthric  Naming, repetition, fluency, and comprehension intact. Cranial Nerves: PERRL EOMI, visual fields full, weakness of the left complete face-unable to wrinkle forehead, unable to close eyelids completely, Bell's phenomenon noted on eye closing, lower facial weakness at rest and on smiling, facial sensation intact, hearing intact, tongue/uvula/soft palate midline, normalsternocleidomastoid and trapezius muscle strength. No evidence of tongue atrophy or fibrillations Motor: 5/5 in all fours with no drift Tone: is normal and bulk is normal Sensation- Intact to light touch bilaterally Coordination: FTN intact bilaterally, no ataxia in BLE. Gait- deferred  NIHSS-2 for facial  Labs I have reviewed labs in epic and the results pertinent to this consultation are:    Latest Ref Rng & Units 09/14/2022    8:29 AM 03/03/2016    8:02 AM 03/02/2016    4:17 AM  CBC  WBC 4.0 - 10.5 K/uL 11.3  13.7  14.3   Hemoglobin 12.0 - 15.0 g/dL 13.3  12.6  12.9   Hematocrit 36.0 - 46.0 % 40.1  37.0  37.3   Platelets 150 - 400 K/uL 176  182  192       Latest Ref Rng & Units 09/14/2022    8:29 AM 05/31/2020   10:45 AM 03/02/2016    4:17 AM  BMP  Glucose 70 - 99 mg/dL 136   139   BUN 8 - 23 mg/dL 15   21   Creatinine 0.44 - 1.00 mg/dL 0.72   0.75   Sodium 135 - 145 mmol/L 137   141   Potassium 3.5 - 5.1 mmol/L 4.1  3.5  3.5   Chloride 98 - 111 mmol/L 105   107   CO2 22 - 32 mmol/L 24   27   Calcium 8.9 - 10.3 mg/dL 8.9   9.5    Imaging I have reviewed the images obtained:  CT-head-no acute changes.   Assessment: 89 year old woman with above past medical history presenting for evaluation of left facial droop.  Her facial droop involves the whole face including her inability to close her eyelids, wrinkle her forehead completely on the left as well as  lower facial weakness making me suspect that this is a little element type facial nerve palsy/Bell's palsy rather than a stroke. Given her risk factors, stat CT head was done that showed no acute changes Because of her history of cancer, I will recommend that we do an MRI of the brain to rule out mets versus a small infarct. Not a candidate for TNKase due to low suspicion for stroke and mild deficits. Not a candidate for thrombectomy due to exam not consistent with LVO hence emergent vessel imaging was  not performed  IMPRESSION Left sided facial nerve palsy (Bell's palsy)  Recommendations: MRI of the brain without contrast-if negative, can be treated as Bell's palsy with acyclovir and steroids. Outpatient neurology follow up in 2-4 weeks. PCP follow up in 2 weeks. If the MRI turns out to be positive for a small stroke, then she will need admission for stroke risk factor workup. Plan was discussed with the ED provider Dr. Neldon Mc MRI Brain w/o: No evidence stroke.  No acute findings. Treat as Bell's palsy. Follow-up with PCP in 2 weeks Follow-up with outpatient neurology in 2 to 4 weeks. Relayed plan to Dr. Jacelyn Grip.  -- Amie Portland, MD Neurologist [facility] Pager: [phone]

## 2022-09-14 NOTE — ED Notes (Signed)
Patient transported to MRI 

## 2022-09-14 NOTE — ED Notes (Signed)
Pt discharge to home. Pt VSS, GCS 15, NAD. Pt verbalized understanding of discharge instructions with no additional questions at this time.  

## 2022-12-12 ENCOUNTER — Ambulatory Visit: Payer: Medicare Other | Admitting: Podiatry

## 2022-12-12 ENCOUNTER — Encounter: Payer: Self-pay | Admitting: Podiatry

## 2022-12-12 DIAGNOSIS — M79674 Pain in right toe(s): Secondary | ICD-10-CM

## 2022-12-12 DIAGNOSIS — B351 Tinea unguium: Secondary | ICD-10-CM | POA: Diagnosis not present

## 2022-12-12 DIAGNOSIS — M79675 Pain in left toe(s): Secondary | ICD-10-CM | POA: Diagnosis not present

## 2022-12-12 NOTE — Progress Notes (Signed)
  Subjective:  Patient ID: Crystal Blanchard, female    DOB: 25-Aug-1931,  MRN: [id number]  Chief Complaint  Patient presents with   Nail Problem    Long thick discolored nails    89 y.o. female returns for the above complaint.  Patient presents with thickened elongated dystrophic mycotic toenails x 10 mild pain on palpation worse with ambulation is with pressure he is not able to debride down himself he would like me to do it.  Objective:  There were no vitals filed for this visit. Podiatric Exam: Vascular: dorsalis pedis and posterior tibial pulses are palpable bilateral. Capillary return is immediate. Temperature gradient is WNL. Skin turgor WNL  Sensorium: Normal Semmes Weinstein monofilament test. Normal tactile sensation bilaterally. Nail Exam: Pt has thick disfigured discolored nails with subungual debris noted bilateral entire nail hallux through fifth toenails.  Pain on palpation to the nails. Ulcer Exam: There is no evidence of ulcer or pre-ulcerative changes or infection. Orthopedic Exam: Muscle tone and strength are WNL. No limitations in general ROM. No crepitus or effusions noted.  Skin: No Porokeratosis. No infection or ulcers    Assessment & Plan:   1. Pain due to onychomycosis of toenails of both feet     Patient was evaluated and treated and all questions answered.  Onychomycosis with pain  -Nails palliatively debrided as below. -Educated on self-care  Procedure: Nail Debridement Rationale: pain  Type of Debridement: manual, sharp debridement. Instrumentation: Nail nipper, rotary burr. Number of Nails: 10  Procedures and Treatment: Consent by patient was obtained for treatment procedures. The patient understood the discussion of treatment and procedures well. All questions were answered thoroughly reviewed. Debridement of mycotic and hypertrophic toenails, 1 through 5 bilateral and clearing of subungual debris. No ulceration, no infection noted.  Return  Visit-Office Procedure: Patient instructed to return to the office for a follow up visit 3 months for continued evaluation and treatment.  Nicholes Rough, DPM    Return in about 3 months (around 03/14/2023) for [place] .

## 2023-03-22 ENCOUNTER — Ambulatory Visit: Payer: Medicare Other | Admitting: Podiatry

## 2023-03-22 ENCOUNTER — Encounter: Payer: Self-pay | Admitting: Podiatry

## 2023-03-22 DIAGNOSIS — M79674 Pain in right toe(s): Secondary | ICD-10-CM

## 2023-03-22 DIAGNOSIS — M79675 Pain in left toe(s): Secondary | ICD-10-CM

## 2023-03-22 DIAGNOSIS — B351 Tinea unguium: Secondary | ICD-10-CM

## 2023-03-27 NOTE — Progress Notes (Signed)
Subjective:  Patient ID: Crystal Blanchard, female    DOB: 1931-07-29,  MRN: [id number]  89 y.o. female presents painful elongated mycotic toenails 1-5 bilaterally which are tender when wearing enclosed shoe gear. Pain is relieved with periodic professional debridement.  Chief Complaint  Patient presents with   RFC    NAIL CLIPPING    New problem(s): None   PCP is Jaclyn Shaggy, MD.  No Known Allergies  Review of Systems: Negative except as noted in the HPI.   Objective:  Crystal Blanchard is a pleasant 89 y.o. female WD, WN in NAD.Marland Kitchen AAO x 3.  Vascular Examination: Vascular status intact b/l with palpable pedal pulses. CFT immediate b/l. Pedal hair present. No edema. No pain with calf compression b/l. Skin temperature gradient WNL b/l. No varicosities noted. No cyanosis or clubbing noted.  Neurological Examination: Sensation grossly intact b/l with 10 gram monofilament. Vibratory sensation intact b/l.  Dermatological Examination: Pedal skin with normal turgor, texture and tone b/l. No open wounds nor interdigital macerations noted. Toenails 1-5 b/l thick, discolored, elongated with subungual debris and pain on dorsal palpation. No hyperkeratotic lesions noted b/l.   Musculoskeletal Examination: Muscle strength 5/5 to b/l LE.  No pain, crepitus noted b/l. No gross pedal deformities. Patient ambulates independently without assistive aids.   Radiographs: None  Assessment:   1. Pain due to onychomycosis of toenails of both feet    Plan:  Patient was evaluated and treated. All patient's and/or POA's questions/concerns addressed on today's visit. Mycotic toenails 1-5 debrided in length and girth without incident. Continue soft, supportive shoe gear daily. Report any pedal injuries to medical professional. Call office if there are any quesitons/concerns. -Patient/POA to call should there be question/concern in the interim.  Return in about 3 months (around 06/22/2023).  Freddie Breech, DPM

## 2023-04-12 ENCOUNTER — Other Ambulatory Visit: Payer: Self-pay | Admitting: General Surgery

## 2023-04-12 DIAGNOSIS — Z853 Personal history of malignant neoplasm of breast: Secondary | ICD-10-CM

## 2023-05-02 ENCOUNTER — Ambulatory Visit
Admission: RE | Admit: 2023-05-02 | Discharge: 2023-05-02 | Disposition: A | Discharge status: Home / Self Care | Payer: Medicare Other | Source: Ambulatory Visit | Attending: General Surgery

## 2023-05-02 ENCOUNTER — Ambulatory Visit
Admission: RE | Admit: 2023-05-02 | Discharge: 2023-05-02 | Disposition: A | Discharge status: Home / Self Care | Payer: Medicare Other | Source: Ambulatory Visit | Attending: General Surgery | Admitting: General Surgery

## 2023-05-02 DIAGNOSIS — Z853 Personal history of malignant neoplasm of breast: Secondary | ICD-10-CM | POA: Diagnosis present

## 2023-06-22 ENCOUNTER — Ambulatory Visit: Payer: Medicare Other | Admitting: Podiatry

## 2023-08-09 ENCOUNTER — Ambulatory Visit (INDEPENDENT_AMBULATORY_CARE_PROVIDER_SITE_OTHER): Payer: Medicare Other | Admitting: Podiatry

## 2023-08-09 DIAGNOSIS — Z91198 Patient's noncompliance with other medical treatment and regimen for other reason: Secondary | ICD-10-CM

## 2023-08-09 NOTE — Progress Notes (Signed)
 1. Failure to attend appointment with reason given    Patient ill and called to cancel appointment.

## 2023-08-31 ENCOUNTER — Encounter: Payer: Self-pay | Admitting: Podiatry

## 2023-08-31 ENCOUNTER — Ambulatory Visit: Payer: Medicare Other | Admitting: Podiatry

## 2023-08-31 VITALS — Ht 66.0 in | Wt 186.7 lb

## 2023-08-31 DIAGNOSIS — M79675 Pain in left toe(s): Secondary | ICD-10-CM

## 2023-08-31 DIAGNOSIS — M79674 Pain in right toe(s): Secondary | ICD-10-CM | POA: Diagnosis not present

## 2023-08-31 DIAGNOSIS — B351 Tinea unguium: Secondary | ICD-10-CM | POA: Diagnosis not present

## 2023-09-07 NOTE — Progress Notes (Signed)
  Subjective:  Patient ID: Crystal Blanchard, female    DOB: 04/07/1932,  MRN: [id number]  89 y.o. female presents painful, elongated thickened toenails x 10 which are symptomatic when wearing enclosed shoe gear. This interferes with his/her daily activities. Chief Complaint  Patient presents with   Nail Problem    Pt is here for [facility] PCP is Dr Arlana Pouch and LOV was a few weeks ago.    New problem(s): None   PCP is Jaclyn Shaggy, MD.  No Known Allergies  Review of Systems: Negative except as noted in the HPI.   Objective:  Crystal Blanchard is a pleasant 89 y.o. female in NAD. AAO x 3.  Vascular Examination: Vascular status intact b/l with palpable pedal pulses. CFT immediate b/l. Pedal hair present. No edema. No pain with calf compression b/l. Skin temperature gradient WNL b/l. No varicosities noted. No cyanosis or clubbing noted.  Neurological Examination: Sensation grossly intact b/l with 10 gram monofilament. Vibratory sensation intact b/l.  Dermatological Examination: Pedal skin with normal turgor, texture and tone b/l. No open wounds nor interdigital macerations noted. Toenails 1-5 b/l thick, discolored, elongated with subungual debris and pain on dorsal palpation. No corns, calluses nor porokeratotic lesions noted.  Musculoskeletal Examination: Muscle strength 5/5 to b/l LE.  No pain, crepitus noted b/l. No gross pedal deformities. Patient ambulates independently without assistive aids.   Radiographs: None  Last A1c:       No data to display           Assessment:   1. Pain due to onychomycosis of toenails of both feet    Plan:  Consent given for treatment. Patient examined. All patient's and/or POA's questions/concerns addressed on today's visit.Toenails 1-5 debrided in length and girth without incident. Continue soft, supportive shoe gear daily. Report any pedal injuries to medical professional. Call office if there are any questions/concerns. -Patient/POA to call should  there be question/concern in the interim.  Return in about 3 months (around 12/01/2023).  Freddie Breech, DPM      [place] LOCATION: [address] [place] [address]                   Office [phone]   [facility] LOCATION: [address] [place], [place] [address] Office [phone]

## 2023-10-10 ENCOUNTER — Other Ambulatory Visit: Payer: Self-pay | Admitting: General Surgery

## 2023-10-10 DIAGNOSIS — R921 Mammographic calcification found on diagnostic imaging of breast: Secondary | ICD-10-CM

## 2023-10-10 DIAGNOSIS — R928 Other abnormal and inconclusive findings on diagnostic imaging of breast: Secondary | ICD-10-CM

## 2023-11-09 ENCOUNTER — Ambulatory Visit
Admission: RE | Admit: 2023-11-09 | Discharge: 2023-11-09 | Disposition: A | Discharge status: Home / Self Care | Source: Ambulatory Visit | Attending: General Surgery | Admitting: General Surgery

## 2023-11-09 DIAGNOSIS — R928 Other abnormal and inconclusive findings on diagnostic imaging of breast: Secondary | ICD-10-CM | POA: Insufficient documentation

## 2023-11-09 DIAGNOSIS — R921 Mammographic calcification found on diagnostic imaging of breast: Secondary | ICD-10-CM | POA: Diagnosis present

## 2023-12-03 ENCOUNTER — Ambulatory Visit: Admitting: Podiatry

## 2023-12-03 ENCOUNTER — Encounter: Payer: Self-pay | Admitting: Podiatry

## 2023-12-03 DIAGNOSIS — B351 Tinea unguium: Secondary | ICD-10-CM | POA: Diagnosis not present

## 2023-12-03 DIAGNOSIS — M79675 Pain in left toe(s): Secondary | ICD-10-CM

## 2023-12-03 DIAGNOSIS — M79674 Pain in right toe(s): Secondary | ICD-10-CM

## 2023-12-08 NOTE — Progress Notes (Signed)
  Subjective:  Patient ID: Crystal Blanchard, female    DOB: 03/02/1932,  MRN: [id number]  89 y.o. female presents to clinic with  painful thick toenails that are difficult to trim. Pain interferes with ambulation. Aggravating factors include wearing enclosed shoe gear. Pain is relieved with periodic professional debridement. She is accompanied by her daughter on today's visit. Chief Complaint  Patient presents with   Rfc    Rm2 RFC / Dr. Corlis last visit May 2025   New problem(s): None   PCP is Corlis Honor BROCKS, MD.  No Known Allergies  Review of Systems: Negative except as noted in the HPI.   Objective:  Crystal Blanchard is a pleasant 89 y.o. female in NAD. AAO x 3.  Vascular Examination: Vascular status intact b/l with palpable pedal pulses. CFT immediate b/l. No edema. No pain with calf compression b/l. Skin temperature gradient WNL b/l. No ischemia or gangrene noted b/l LE. No cyanosis or clubbing noted b/l LE.  Neurological Examination: Sensation grossly intact b/l with 10 gram monofilament. Vibratory sensation intact b/l.   Dermatological Examination: Pedal skin with normal turgor, texture and tone b/l. Toenails 1-5 b/l thick, discolored, elongated with subungual debris and pain on dorsal palpation. No hyperkeratotic lesions noted b/l.   Musculoskeletal Examination: Muscle strength 5/5 to b/l LE. No pain, crepitus or joint limitation noted with ROM bilateral LE. No gross bony deformities bilaterally.  Radiographs: None  Last A1c:       No data to display           Assessment:   1. Pain due to onychomycosis of toenails of both feet    Plan:  Patient was evaluated and treated. All patient's and/or POA's questions/concerns addressed on today's visit. Mycotic toenails 1-5 debrided in length and girth without incident. Continue soft, supportive shoe gear daily. Report any pedal injuries to medical professional. Call office if there are any quesitons/concerns. -Patient/POA to  call should there be question/concern in the interim.  Return in about 3 months (around 03/04/2024).  Delon LITTIE Merlin, DPM      [place] LOCATION: [address], [place] [address]                   Office [phone]   [facility] LOCATION: [address] [place], [place] [address] Office [phone]

## 2024-03-14 ENCOUNTER — Ambulatory Visit (INDEPENDENT_AMBULATORY_CARE_PROVIDER_SITE_OTHER): Payer: Self-pay | Admitting: Podiatry

## 2024-03-14 DIAGNOSIS — Z91198 Patient's noncompliance with other medical treatment and regimen for other reason: Secondary | ICD-10-CM

## 2024-03-14 NOTE — Progress Notes (Signed)
 1. Failure to attend appointment with reason given    Appointment rescheduled by patient.

## 2024-04-21 ENCOUNTER — Ambulatory Visit: Admitting: Podiatry

## 2024-04-21 ENCOUNTER — Encounter: Payer: Self-pay | Admitting: Podiatry

## 2024-04-21 DIAGNOSIS — M79674 Pain in right toe(s): Secondary | ICD-10-CM

## 2024-04-21 DIAGNOSIS — M79675 Pain in left toe(s): Secondary | ICD-10-CM | POA: Diagnosis not present

## 2024-04-21 DIAGNOSIS — B351 Tinea unguium: Secondary | ICD-10-CM

## 2024-04-28 NOTE — Progress Notes (Signed)
  Subjective:  Patient ID: Crystal Blanchard, female    DOB: March 04, 1932,  MRN: [id number]  Crystal Blanchard presents to clinic today for painful mycotic toenails of both feet that are difficult to trim. Pain interferes with daily activities and wearing enclosed shoe gear comfortably.  Chief Complaint  Patient presents with   Toe Pain    Dr. Georgina is her PCP as Dr. Corlis has retired. She was there in Sept.    New problem(s): None.   PCP is Corlis Honor BROCKS, MD (Inactive).  No Known Allergies  Review of Systems: Negative except as noted in the HPI.  Objective: No changes noted in today's physical examination. There were no vitals filed for this visit. Crystal Blanchard is a pleasant 89 y.o. female in NAD. AAO x 3.  Vascular Examination: Vascular status intact b/l with palpable pedal pulses. CFT immediate b/l. No edema. No pain with calf compression b/l. Skin temperature gradient WNL b/l. No ischemia or gangrene noted b/l LE. No cyanosis or clubbing noted b/l LE.  Neurological Examination: Sensation grossly intact b/l with 10 gram monofilament. Vibratory sensation intact b/l.   Dermatological Examination: Pedal skin with normal turgor, texture and tone b/l. Toenails 1-5 b/l thick, discolored, elongated with subungual debris and pain on dorsal palpation. No hyperkeratotic lesions noted b/l.   Musculoskeletal Examination: Muscle strength 5/5 to b/l LE. No pain, crepitus or joint limitation noted with ROM bilateral LE. No gross bony deformities bilaterally.  Radiographs: None  Assessment/Plan: 1. Pain due to onychomycosis of toenails of both feet   Consent given for treatment. Patient examined. All patient's and/or POA's questions/concerns addressed on today's visit. Toenails 1-5 b/l debrided in length and girth without incident. Continue foot and shoe inspections daily. Monitor blood glucose per PCP/Endocrinologist's recommendations. Continue soft, supportive shoe gear daily. Report any pedal  injuries to medical professional. Call office if there are any questions/concerns. -Patient/POA to call should there be question/concern in the interim.   Return in about 3 months (around 07/22/2024).  Delon LITTIE Merlin, DPM      [place] LOCATION: [address] [place] [address]                   Office [phone]   [facility] LOCATION: [address] [place], [place] [address] Office [phone]
